# Patient Record
Sex: Female | Born: 1990 | Race: White | Hispanic: No | Marital: Married | Smoking: Former smoker
Health system: Southern US, Community
[De-identification: ages and names within clinical notes are randomized; demographics above are authoritative.]

## PROBLEM LIST (undated history)

## (undated) DIAGNOSIS — I499 Cardiac arrhythmia, unspecified: Secondary | ICD-10-CM

## (undated) DIAGNOSIS — F32A Depression, unspecified: Secondary | ICD-10-CM

## (undated) DIAGNOSIS — R519 Headache, unspecified: Secondary | ICD-10-CM

## (undated) DIAGNOSIS — R51 Headache: Secondary | ICD-10-CM

## (undated) DIAGNOSIS — F419 Anxiety disorder, unspecified: Secondary | ICD-10-CM

## (undated) DIAGNOSIS — F329 Major depressive disorder, single episode, unspecified: Secondary | ICD-10-CM

## (undated) DIAGNOSIS — R4589 Other symptoms and signs involving emotional state: Secondary | ICD-10-CM

## (undated) HISTORY — DX: Cardiac arrhythmia, unspecified: I49.9

## (undated) HISTORY — DX: Headache, unspecified: R51.9

## (undated) HISTORY — PX: DILATION AND CURETTAGE OF UTERUS: SHX78

## (undated) HISTORY — DX: Depression, unspecified: F32.A

## (undated) HISTORY — PX: WISDOM TOOTH EXTRACTION: SHX21

## (undated) HISTORY — DX: Headache: R51

## (undated) HISTORY — DX: Major depressive disorder, single episode, unspecified: F32.9

---

## 2012-08-24 ENCOUNTER — Other Ambulatory Visit: Payer: Self-pay | Admitting: Family Medicine

## 2012-08-24 ENCOUNTER — Other Ambulatory Visit (HOSPITAL_COMMUNITY)
Admission: RE | Admit: 2012-08-24 | Discharge: 2012-08-24 | Disposition: A | Discharge status: Home / Self Care | Payer: 59 | Source: Ambulatory Visit | Attending: Family Medicine | Admitting: Family Medicine

## 2012-08-24 DIAGNOSIS — Z0000 Encounter for general adult medical examination without abnormal findings: Secondary | ICD-10-CM | POA: Insufficient documentation

## 2012-08-27 ENCOUNTER — Other Ambulatory Visit: Payer: Self-pay | Admitting: Family Medicine

## 2012-08-27 DIAGNOSIS — N644 Mastodynia: Secondary | ICD-10-CM

## 2012-08-29 ENCOUNTER — Ambulatory Visit
Admission: RE | Admit: 2012-08-29 | Discharge: 2012-08-29 | Disposition: A | Discharge status: Home / Self Care | Payer: 59 | Source: Ambulatory Visit | Attending: Family Medicine | Admitting: Family Medicine

## 2012-08-29 DIAGNOSIS — N644 Mastodynia: Secondary | ICD-10-CM

## 2014-06-26 ENCOUNTER — Emergency Department (HOSPITAL_COMMUNITY)
Admission: EM | Admit: 2014-06-26 | Discharge: 2014-06-26 | Discharge status: Against Medical Advice | Payer: Medicaid Other | Attending: Emergency Medicine | Admitting: Emergency Medicine

## 2014-06-26 ENCOUNTER — Encounter (HOSPITAL_COMMUNITY): Payer: Self-pay | Admitting: Emergency Medicine

## 2014-06-26 DIAGNOSIS — Z3A00 Weeks of gestation of pregnancy not specified: Secondary | ICD-10-CM | POA: Diagnosis not present

## 2014-06-26 DIAGNOSIS — O210 Mild hyperemesis gravidarum: Secondary | ICD-10-CM | POA: Insufficient documentation

## 2014-06-26 HISTORY — DX: Anxiety disorder, unspecified: F41.9

## 2014-06-26 HISTORY — DX: Other symptoms and signs involving emotional state: R45.89

## 2014-06-26 HISTORY — DX: Major depressive disorder, single episode, unspecified: F32.9

## 2014-06-26 LAB — URINALYSIS, ROUTINE W REFLEX MICROSCOPIC
Bilirubin Urine: NEGATIVE
Glucose, UA: NEGATIVE mg/dL
Hgb urine dipstick: NEGATIVE
KETONES UR: 15 mg/dL — AB
NITRITE: NEGATIVE
PH: 7.5 (ref 5.0–8.0)
Protein, ur: NEGATIVE mg/dL
SPECIFIC GRAVITY, URINE: 1.023 (ref 1.005–1.030)
Urobilinogen, UA: 1 mg/dL (ref 0.0–1.0)

## 2014-06-26 LAB — COMPREHENSIVE METABOLIC PANEL
ALK PHOS: 63 U/L (ref 39–117)
ALT: 13 U/L (ref 0–35)
AST: 15 U/L (ref 0–37)
Albumin: 3.6 g/dL (ref 3.5–5.2)
Anion gap: 11 (ref 5–15)
BILIRUBIN TOTAL: 0.4 mg/dL (ref 0.3–1.2)
CHLORIDE: 105 mmol/L (ref 96–112)
CO2: 21 mmol/L (ref 19–32)
CREATININE: 0.64 mg/dL (ref 0.50–1.10)
Calcium: 9.2 mg/dL (ref 8.4–10.5)
GFR calc non Af Amer: >90 mL/min (ref >90)
Glucose, Bld: 92 mg/dL (ref 70–99)
Potassium: 3.8 mmol/L (ref 3.5–5.1)
Sodium: 137 mmol/L (ref 135–145)
Total Protein: 7.3 g/dL (ref 6.0–8.3)

## 2014-06-26 LAB — CBC WITH DIFFERENTIAL/PLATELET
BASOS ABS: 0 10*3/uL (ref 0.0–0.1)
Basophils Relative: 0 % (ref 0–1)
EOS ABS: 0.1 10*3/uL (ref 0.0–0.7)
Eosinophils Relative: 1 % (ref 0–5)
HEMATOCRIT: 39.3 % (ref 36.0–46.0)
Hemoglobin: 13.1 g/dL (ref 12.0–15.0)
Lymphocytes Relative: 26 % (ref 12–46)
Lymphs Abs: 1.9 10*3/uL (ref 0.7–4.0)
MCH: 28 pg (ref 26.0–34.0)
MCHC: 33.3 g/dL (ref 30.0–36.0)
MCV: 84 fL (ref 78.0–100.0)
MONO ABS: 0.6 10*3/uL (ref 0.1–1.0)
Monocytes Relative: 8 % (ref 3–12)
NEUTROS ABS: 4.7 10*3/uL (ref 1.7–7.7)
Neutrophils Relative %: 65 % (ref 43–77)
Platelets: 243 10*3/uL (ref 150–400)
RBC: 4.68 MIL/uL (ref 3.87–5.11)
RDW: 12.8 % (ref 11.5–15.5)
WBC: 7.3 10*3/uL (ref 4.0–10.5)

## 2014-06-26 LAB — POC URINE PREG, ED: Preg Test, Ur: POSITIVE — AB

## 2014-06-26 LAB — URINE MICROSCOPIC-ADD ON

## 2014-06-26 NOTE — ED Notes (Addendum)
PT has been skipping periods since January. Had light period in February that was not like normal periods. Hx of miscarriage where didn't expel and she says feels similar. Recently started on antidepressant and antianxiety meds in January. Past couple of days has be anorexic and emesis x 6. Denies diarr. Had 2 positive preg tests a few days ago.

## 2014-06-26 NOTE — ED Notes (Signed)
EMT in mini lab informed nurse first of positive pregnancy test, pt informed, awaiting ED bed placement, pt is drinking sprite, in no acute distress.

## 2014-06-26 NOTE — ED Notes (Signed)
Pt called again for room no response. 

## 2014-06-26 NOTE — ED Notes (Signed)
Called for room x3 no response 

## 2015-02-28 ENCOUNTER — Emergency Department (HOSPITAL_COMMUNITY)
Admission: EM | Admit: 2015-02-28 | Discharge: 2015-02-28 | Disposition: A | Discharge status: Home / Self Care | Payer: Medicaid Other | Attending: Emergency Medicine | Admitting: Emergency Medicine

## 2015-02-28 ENCOUNTER — Encounter (HOSPITAL_COMMUNITY): Payer: Self-pay | Admitting: *Deleted

## 2015-02-28 DIAGNOSIS — F17200 Nicotine dependence, unspecified, uncomplicated: Secondary | ICD-10-CM | POA: Insufficient documentation

## 2015-02-28 DIAGNOSIS — F419 Anxiety disorder, unspecified: Secondary | ICD-10-CM | POA: Insufficient documentation

## 2015-02-28 DIAGNOSIS — E669 Obesity, unspecified: Secondary | ICD-10-CM | POA: Insufficient documentation

## 2015-02-28 DIAGNOSIS — F329 Major depressive disorder, single episode, unspecified: Secondary | ICD-10-CM | POA: Insufficient documentation

## 2015-02-28 DIAGNOSIS — R51 Headache: Secondary | ICD-10-CM | POA: Insufficient documentation

## 2015-02-28 LAB — BASIC METABOLIC PANEL
ANION GAP: 9 (ref 5–15)
BUN: 10 mg/dL (ref 6–20)
CALCIUM: 9 mg/dL (ref 8.9–10.3)
CO2: 24 mmol/L (ref 22–32)
Chloride: 106 mmol/L (ref 101–111)
Creatinine, Ser: 0.67 mg/dL (ref 0.44–1.00)
GFR calc Af Amer: >60 mL/min (ref >60)
Glucose, Bld: 90 mg/dL (ref 65–99)
POTASSIUM: 3.8 mmol/L (ref 3.5–5.1)
SODIUM: 139 mmol/L (ref 135–145)

## 2015-02-28 LAB — CBC
HCT: 40.5 % (ref 36.0–46.0)
HEMOGLOBIN: 13.3 g/dL (ref 12.0–15.0)
MCH: 28.4 pg (ref 26.0–34.0)
MCHC: 32.8 g/dL (ref 30.0–36.0)
MCV: 86.4 fL (ref 78.0–100.0)
Platelets: 262 10*3/uL (ref 150–400)
RBC: 4.69 MIL/uL (ref 3.87–5.11)
RDW: 13.3 % (ref 11.5–15.5)
WBC: 9.5 10*3/uL (ref 4.0–10.5)

## 2015-02-28 LAB — I-STAT TROPONIN, ED: Troponin i, poc: 0 ng/mL (ref 0.00–0.08)

## 2015-02-28 NOTE — ED Notes (Signed)
Bed: WA04 Expected date: 02/28/15 Expected time: 3:37 PM Means of arrival: Ambulance Comments: Hyperventilating, anxiety

## 2015-02-28 NOTE — ED Provider Notes (Signed)
CSN: [id number]     Arrival date & time 02/28/15  1441 History   First MD Initiated Contact with Patient 02/28/15 1534     Chief Complaint  Patient presents with  . Anxiety  . Chest Pain  . Headache     (Consider location/radiation/quality/duration/timing/severity/associated sxs/prior Treatment) HPI   Ariana Harper is a 24 y.o. female  was at work today, when she had a sudden sensation of lightheadedness. Tingling in her hands, short of breath and chest discomfort. The symptoms persisted, for about an hour, then have resolved completely. She reports she is under stress with a problem of purchasing a new home, and having town guests visiting her for the holiday. She's never had a problem exactly like this, although she has had mild anxiety attacks in the past. She denies recent fever, chills, cough, shortness of breath, new weakness or dizziness. There are no other known modifying factors  Past Medical History  Diagnosis Date  . Depressed mood   . Anxiety    History reviewed. No pertinent past surgical history. No family history on file. Social History  Substance Use Topics  . Smoking status: Current Every Day Smoker  . Smokeless tobacco: None  . Alcohol Use: No   OB History    No data available     Review of Systems  All other systems reviewed and are negative.     Allergies  Azithromycin  Home Medications   Prior to Admission medications   Medication Sig Start Date End Date Taking? Authorizing Provider  ALPRAZolam Prudy Feeler(XANAX) 0.5 MG tablet Take 0.5 mg by mouth 2 (two) times daily as needed for anxiety.   Yes Historical Provider, MD   BP 120/75 mmHg  Pulse 71  Temp(Src) 97.9 F (36.6 C) (Oral)  Resp 20  SpO2 100%  LMP 02/28/2015 Physical Exam  Constitutional: She is oriented to person, place, and time. She appears well-developed.  Obese   HENT:  Head: Normocephalic and atraumatic.  Eyes: Conjunctivae and EOM are normal. Pupils are equal, round, and  reactive to light.  Neck: Normal range of motion and phonation normal. Neck supple.  Cardiovascular: Normal rate and regular rhythm.   Pulmonary/Chest: Effort normal and breath sounds normal. She exhibits no tenderness.  Abdominal: Soft. She exhibits no distension. There is no tenderness. There is no guarding.  Musculoskeletal: Normal range of motion.  Neurological: She is alert and oriented to person, place, and time. She exhibits normal muscle tone.  Skin: Skin is warm and dry.  Psychiatric: Her behavior is normal. Judgment and thought content normal.  Nursing note and vitals reviewed.   ED Course  Procedures (including critical care time)  Findings discussed with the patient, all questions answered.   Labs Review Labs Reviewed  BASIC METABOLIC PANEL  CBC  I-STAT TROPOININ, ED    Imaging Review No results found. I have personally reviewed and evaluated these images and lab results as part of my medical decision-making.   EKG Interpretation   Date/Time:  Saturday February 28 2015 15:02:39 EST Ventricular Rate:  63 PR Interval:  179 QRS Duration: 89 QT Interval:  423 QTC Calculation: 433 R Axis:   14 Text Interpretation:  Sinus rhythm RSR' in V1 or V2, probably normal  variant No old tracing to compare Confirmed by [facility]  MD, Kavir Savoca [phone]([id number])  on 02/28/2015 3:56:24 PM      MDM   Final diagnoses:  Anxiety    Valuation, consistent with a panic attack. Doubt acute psychosis, metabolic  instability or impending vascular collapse.  Nursing Notes Reviewed/ Care Coordinated Applicable Imaging Reviewed Interpretation of Laboratory Data incorporated into ED treatment  The patient appears reasonably screened and/or stabilized for discharge and I doubt any other medical condition or other [facility] requiring further screening, evaluation, or treatment in the ED at this time prior to discharge.  Plan: Home Medications- usual; Home Treatments- rest; return here if the  recommended treatment, does not improve the symptoms; Recommended follow up- PCP prn    Mancel Bale, MD 03/01/15 0009

## 2015-02-28 NOTE — ED Notes (Addendum)
Per EMS, pt was at work, where she states she had an "overwhelming" feeling. Pt states she felt lightheaded, chills and tingling in her hands. Pt was put on O2, after which the tingling resolved. Pt has hx of anxiety attacks, is currently on Xanax. Pt denied chest pain upon EMS arrival, pt complains of chest pressure in triage. Pt states it does not feel like normal anxiety attack. Pt also complains of headache.

## 2015-03-24 ENCOUNTER — Encounter (HOSPITAL_COMMUNITY): Payer: Self-pay

## 2015-03-24 ENCOUNTER — Emergency Department (HOSPITAL_COMMUNITY): Payer: Medicaid Other

## 2015-03-24 ENCOUNTER — Emergency Department (HOSPITAL_COMMUNITY)
Admission: EM | Admit: 2015-03-24 | Discharge: 2015-03-24 | Disposition: A | Discharge status: Home / Self Care | Payer: Medicaid Other | Attending: Emergency Medicine | Admitting: Emergency Medicine

## 2015-03-24 DIAGNOSIS — F17200 Nicotine dependence, unspecified, uncomplicated: Secondary | ICD-10-CM | POA: Insufficient documentation

## 2015-03-24 DIAGNOSIS — E876 Hypokalemia: Secondary | ICD-10-CM | POA: Insufficient documentation

## 2015-03-24 DIAGNOSIS — R079 Chest pain, unspecified: Secondary | ICD-10-CM | POA: Insufficient documentation

## 2015-03-24 DIAGNOSIS — F419 Anxiety disorder, unspecified: Secondary | ICD-10-CM | POA: Insufficient documentation

## 2015-03-24 DIAGNOSIS — R002 Palpitations: Secondary | ICD-10-CM

## 2015-03-24 LAB — BASIC METABOLIC PANEL
ANION GAP: 9 (ref 5–15)
BUN: 12 mg/dL (ref 6–20)
CHLORIDE: 108 mmol/L (ref 101–111)
CO2: 24 mmol/L (ref 22–32)
CREATININE: 0.67 mg/dL (ref 0.44–1.00)
Calcium: 8.9 mg/dL (ref 8.9–10.3)
GFR calc non Af Amer: >60 mL/min (ref >60)
Glucose, Bld: 98 mg/dL (ref 65–99)
POTASSIUM: 3.2 mmol/L — AB (ref 3.5–5.1)
SODIUM: 141 mmol/L (ref 135–145)

## 2015-03-24 LAB — I-STAT TROPONIN, ED: Troponin i, poc: 0 ng/mL (ref 0.00–0.08)

## 2015-03-24 LAB — CBC
HEMATOCRIT: 38.3 % (ref 36.0–46.0)
HEMOGLOBIN: 12.4 g/dL (ref 12.0–15.0)
MCH: 27.6 pg (ref 26.0–34.0)
MCHC: 32.4 g/dL (ref 30.0–36.0)
MCV: 85.1 fL (ref 78.0–100.0)
PLATELETS: 271 10*3/uL (ref 150–400)
RBC: 4.5 MIL/uL (ref 3.87–5.11)
RDW: 13.1 % (ref 11.5–15.5)
WBC: 9.1 10*3/uL (ref 4.0–10.5)

## 2015-03-24 LAB — MAGNESIUM: Magnesium: 1.9 mg/dL (ref 1.7–2.4)

## 2015-03-24 MED ORDER — POTASSIUM CHLORIDE CRYS ER 20 MEQ PO TBCR
20.0000 meq | EXTENDED_RELEASE_TABLET | Freq: Once | ORAL | Status: AC
  Administered 2015-03-24: 20 meq via ORAL
  Filled 2015-03-24: qty 1

## 2015-03-24 NOTE — ED Notes (Signed)
Spoke with lab, magnesium will be added on the blood that is already in main lab.

## 2015-03-24 NOTE — ED Provider Notes (Signed)
CSN: [id number]     Arrival date & time 03/24/15  1657 History   First MD Initiated Contact with Patient 03/24/15 1755     Chief Complaint  Patient presents with  . Irregular Heart Beat  . Chest Pain     (Consider location/radiation/quality/duration/timing/severity/associated sxs/prior Treatment) Patient is a 25 y.o. female presenting with chest pain. The history is provided by the patient and medical records. No language interpreter was used.  Chest Pain Associated symptoms: fatigue and palpitations   Associated symptoms: no abdominal pain, no back pain, no cough, no diaphoresis, no dizziness, no fever, no headache, no nausea, no shortness of breath, not vomiting and no weakness    Addison LankStephanie A Filion is a 25 y.o. female  with a PMH of anxiety who presents to the Emergency Department after acute onset of heart palpitations and chest discomfort at approx. 04:00 today. Pt. States she was smoking at time of incident - associated symptoms include lightheadedness and tingling of bilateral UE's. Pt. States similar sxs have occurred in the past and told it was anxiety. At time of evaluation, chest pain and tingling has resolved. No medications taken PTA. Pt. Also admits to diarrhea x 3 days.   Past Medical History  Diagnosis Date  . Depressed mood   . Anxiety    History reviewed. No pertinent past surgical history. History reviewed. No pertinent family history. Social History  Substance Use Topics  . Smoking status: Current Every Day Smoker  . Smokeless tobacco: None  . Alcohol Use: No   OB History    No data available     Review of Systems  Constitutional: Positive for fatigue. Negative for fever, chills and diaphoresis.  HENT: Negative for congestion, rhinorrhea and sore throat.   Respiratory: Negative for cough, shortness of breath and wheezing.   Cardiovascular: Positive for chest pain and palpitations. Negative for leg swelling.  Gastrointestinal: Negative for nausea, vomiting,  abdominal pain, diarrhea and constipation.  Musculoskeletal: Negative for myalgias, back pain, arthralgias and neck pain.  Skin: Negative for rash.  Allergic/Immunologic: Negative for immunocompromised state.  Neurological: Negative for dizziness, weakness and headaches.  Hematological: Does not bruise/bleed easily.      Allergies  Azithromycin  Home Medications   Prior to Admission medications   Medication Sig Start Date End Date Taking? Authorizing Provider  ALPRAZolam Prudy Feeler(XANAX) 0.5 MG tablet Take 0.5 mg by mouth 2 (two) times daily as needed for anxiety.   Yes Historical Provider, MD  Aspirin-Acetaminophen-Caffeine (GOODY HEADACHE PO) Take 1 packet by mouth daily as needed (pain).   Yes Historical Provider, MD   BP 127/70 mmHg  Pulse 63  Temp(Src) 98.3 F (36.8 C) (Oral)  Resp 21  SpO2 99%  LMP 03/24/2015 Physical Exam  Constitutional: She is oriented to person, place, and time. She appears well-developed and well-nourished.  Alert and in no acute distress  HENT:  Head: Normocephalic and atraumatic.  Cardiovascular: Normal rate, regular rhythm, normal heart sounds and intact distal pulses.  Exam reveals no gallop and no friction rub.   No murmur heard. Pulmonary/Chest: Effort normal and breath sounds normal. No respiratory distress. She has no wheezes. She has no rales. She exhibits no tenderness.  Abdominal: She exhibits no mass. There is no rebound and no guarding.  Abdomen soft, non-tender, non-distended Bowel sounds positive in all four quadrants  Musculoskeletal: She exhibits no edema.  Neurological: She is alert and oriented to person, place, and time.  Skin: Skin is warm and dry. No rash  noted.  Psychiatric: She has a normal mood and affect. Her behavior is normal. Judgment and thought content normal.  Nursing note and vitals reviewed.   ED Course  Procedures (including critical care time) Labs Review Labs Reviewed  BASIC METABOLIC PANEL - Abnormal; Notable  for the following:    Potassium 3.2 (*)    All other components within normal limits  CBC  MAGNESIUM  I-STAT TROPOININ, ED    Imaging Review Dg Chest 2 View  03/24/2015  CLINICAL DATA:  Chest pain beginning while smoking EXAM: CHEST  2 VIEW COMPARISON:  None. FINDINGS: Normal heart size and mediastinal contours. No acute infiltrate or edema. No effusion or pneumothorax. No acute osseous findings. IMPRESSION: Negative chest. Electronically Signed   By: Marnee Spring M.D.   On: 03/24/2015 17:22   I have personally reviewed and evaluated these images and lab results as part of my medical decision-making.   EKG Interpretation None      MDM   Final diagnoses:  Hypokalemia  Heart palpitations   RANE DUMM presents with acute onset chest discomfort, tingling of UE's. Hx of similar sxs. HEART score of 1, PERC negative.   Labs: Trop 0.0, CBC wdl, BMP with K+ 3.2, Mag wdl Imaging: CXR shows no acute cardiopulm dz  Therapeutics: PO K+   A&P: Heart palpitations  - low risk for cardiopulm etiology; anxiety or chest wall spasms ddx  - Follow up with PCP  - Smoking cessation discussed Hypokalemia likely 2/2 recent 3 day history of diarrhea  - Follow up with PCP  Chase Picket Aldene Hendon, PA-C 03/24/15 1610  Lavera Guise, MD 03/25/15 1432

## 2015-03-24 NOTE — ED Notes (Signed)
Pt states she was at work when she was smoking.  Sudden numbness to left arm and felt heart racing. No symptoms of changes prior to event.  Pt states now she is light headed.

## 2015-03-24 NOTE — Discharge Instructions (Signed)
Follow up with primary physician in the next 2-3 days. See attached resource guide for help finding a primary doctor Return to ER for any new or worsening symptoms, any additional concerns.   Emergency Department Resource Guide 1) Find a Doctor and Pay Out of Pocket Although you won't have to find out who is covered by your insurance plan, it is a good idea to ask around and get recommendations. You will then need to call the office and see if the doctor you have chosen will accept you as a new patient and what types of options they offer for patients who are self-pay. Some doctors offer discounts or will set up payment plans for their patients who do not have insurance, but you will need to ask so you aren't surprised when you get to your appointment.  2) Contact Your Local Health Department Not all health departments have doctors that can see patients for sick visits, but many do, so it is worth a call to see if yours does. If you don't know where your local health department is, you can check in your phone book. The CDC also has a tool to help you locate your state's health department, and many state websites also have listings of all of their local health departments.  3) Find a Walk-in Clinic If your illness is not likely to be very severe or complicated, you may want to try a walk in clinic. These are popping up all over the country in pharmacies, drugstores, and shopping centers. They're usually staffed by nurse practitioners or physician assistants that have been trained to treat common illnesses and complaints. They're usually fairly quick and inexpensive. However, if you have serious medical issues or chronic medical problems, these are probably not your best option.  No Primary Care Doctor: - Call Health Connect at  [phone] - they can help you locate a primary care doctor that  accepts your insurance, provides certain services, etc. - Physician Referral Service- [phone]  Chronic  Pain Problems: Organization         Address  Phone   Notes  [facility]  [phone] Patients need to be referred by their primary care doctor.   Medication Assistance: Organization         Address  Phone   Notes  [facility] Medication [facility] [address] [place], [place] [address] [phone] --Must be a resident of [facility] -- Must have NO insurance coverage whatsoever (no Medicaid/ Medicare, etc.) -- The pt. MUST have a primary care doctor that directs their care regularly and follows them in the community   MedAssist  [phone]   [employer]  [phone]    Agencies that provide inexpensive medical care: Organization         Address  Phone   Notes  [facility]  [phone]   [facility]    [phone]   [facility] [address] [place], [place] [address] [phone]   [facility] [place] [address], [place] [phone]   [facility]    [phone]) [phone]   [facility]    [phone]   [facility]  [address], [place] Phone:  [phone], Fax:  [phone]) [phone] Hours of Operation:  9 am - 6 pm, M-F.  Also accepts Medicaid/Medicare and self-pay.  [facility]  [address], [place] Phone: [phone], Fax: [phone]. Hours of Operation:  8:30 am - 5:30 pm, M-F.  Also accepts Medicaid and self-pay.  °[facility] [place] [address], [place] Phone: [phone]   °[facility] [address], [place], [place] [phone], Ext. 123 Mondays & Thursdays: 7-9 AM.  First 15 patients are seen on a first come, first serve basis. °  ° °Medicaid-accepting [place] Providers: ° °Organization         Address  Phone   Notes  °[facility] [address], [place] [phone] Also  accepts self-pay patients.  °[facility] [address], [place] ° [phone]   °[facility] [address], [place] [phone]   °[facility] [address] [place] [address], [place] [phone]   °Veita Bland [address], [place]  ° [phone] Only accepts [place] Access Medicaid patients after they have their name applied to their card.  ° °Self-Pay (no insurance) in [place]: ° °Organization         Address  Phone   Notes  °Sickle Cell Patients, [facility] [address], [place] [phone]   °[facility] [address], [place] [phone]   °[facility] [place] ° [address], [place] [phone]   °[facility]/Dr. Osei-Bonsu ° [address] [place] [address], [place] or [address], [place] [phone] Phone number for both [place] and [place] locations is the same.  °[facility] [address], [place] [phone]) [phone]   °[facility] [address] [place] [address], [place] or [address] [phone]) [phone] °([phone]) [phone]   °[facility] [address], [place] [phone], phone; [phone], fax Sees patients 1st and 3rd Saturday of every month.  Must not qualify for public or private insurance (i.e. Medicaid, Medicare, [place] Health Choice, Veterans' Benefits) • Household income should be no more than 200% of the poverty level •The clinic cannot treat you if you are pregnant or think you are pregnant • Sexually transmitted diseases are not treated at the clinic.  ° ° °Dental Care: °Organization         Address  Phone  Notes  °[place] [facility] [address], [place] [phone]) [phone] Accepts children up to age 21 who are enrolled in Medicaid or [place] Health Choice; pregnant  women with a Medicaid card; and children who have applied for Medicaid or [place] Health Choice, but were declined, whose parents can pay a reduced fee at time of service.  °[place] [facility] [place]  [address], [place] [phone]) [phone] Accepts children up to age 21 who are enrolled in Medicaid or [place] Health Choice; pregnant women with a Medicaid card; and children who have applied for Medicaid or  Health Choice, but were declined, whose parents can pay a reduced fee at time of service.  °[facility] ° [address],  [phone] Patients are seen by appointment only. Walk-ins are not accepted. [facility] will see patients 18 years of age and older. °Monday - Tuesday (8am-5pm) °Most Wednesdays (8:30-5pm) °$30 per visit, cash only  °[facility]  [address], [facility] [phone] Patients are seen by appointment only. Walk-ins are not accepted. [place] will see patients 60 years of age and older. One Wednesday Evening (Monthly: Volunteer Based).  $30 per visit, cash only  [place]  [phone] for adults; Children under age 4, call Graduate Pediatric Dentistry at [phone]. Children aged 49-14, please call [phone] to request a pediatric application.  Dental services are provided in all areas of dental care including fillings, crowns and bridges, complete and partial dentures, implants, gum treatment, root canals, and extractions. Preventive care is also provided. Treatment is provided to both adults and children. Patients are selected via a lottery and there is often a waiting list.   [facility] [address] [place]  [phone] [email or web address]   [facility] [address] [place], [place] [phone], Ext. [phone] Second and Fourth Thursday of each month, opens at 6:30 AM; Clinic ends at 9 AM.  Patients are  seen on a first-come first-served basis, and a limited number are seen during each clinic.   [facility]  [address] Hillard Danker [place], [place] [phone]   Eligibility Requirements You must have lived in [place], [place], or [place] counties for at least the last three months.   You cannot be eligible for state or federal sponsored [employer], including [employer], [place], or [employer].   You generally cannot be eligible for healthcare insurance through your employer.    How to apply: Eligibility screenings are held every Tuesday and Wednesday afternoon from 1:00 pm until 4:00 pm. You do not need an appointment for the interview!  [facility] [address] [place], [place]   [place] Department  [place]  [phone]    Behavioral Health Resources in the Community: Intensive Outpatient Programs Organization         Address  Phone  Notes  [facility] [place]. [address], [place], [place] [phone]   [facility] [address], [place], [place]   ADS: [facility] [address] [address], [place], [place]   [place] [address],  [place], [place] or [phone]   Substance Abuse Resources Organization         Address  Phone  Notes  Alcohol and Drug Services  [phone]   [place]  [phone]   [facility] [place]   Chinita Pester  [phone]   Residential & Outpatient Substance Abuse Program  [phone]   Psychological Services Organization         Address  Phone  Notes  [facility] [place]  [place]  [phone]   [place] [address] [place] [phone] or 7327526419    Mobile Crisis  Teams Organization         Address  Phone  Notes  [facility], Mobile Crisis Care Unit  [phone]   [facility]  [address] [place], [place]   [facility] [address] [place] [phone]    Self-Help/Support Groups Organization         Address  Phone             Notes  [facility]ental  [facility] [place] - variety of support groups  [place] Call for more information  Narcotics Anonymous (NA), [facility] [address], [employer] [place]  2 meetings at this location   Special educational needs teacher         Address  Phone  Notes  [facility] [place],    [place]  [phone]   [facility]  [address], [place] [address], [place], [place]   [place], [place] [phone] Admissions: 8am-3pm M-F  Incentives Substance [place] [address],    [place], [place] [phone]   [facility] [address] [place], [place], [place]   [facility] [address]  [place], [place]   [facility] - Intensive Outpatient [address], [place], [place]   [facility] [place]  [place], [place] [phone] or [phone]   [facility] ([facility]) [address] [place], [place] Accepts Medicaid  [facility] [place] [address],  [place] [phone] Substance Abuse/Addiction Treatment   [facility] Organization         Address  Phone  Notes  [facility]  [phone]   Domenic Schwab, PhD [address] Arlis [place], [place]   [phone] or [phone]   [place], [place] [phone]   [facility] [address], [place], [place] [phone]  Insurance/Medicaid/sponsorship through [facility] Families [address] [place] [address]                                    [place], [place] [phone] [place] [address], [place] [phone]    Dr. Adele Schilder  [phone]   [facility] Dept. 1) [address] [place] 2) [place] 3)  [address] [address], [place] [phone]  [phone]   [place] [phone] or [phone] (After Hours)

## 2015-03-24 NOTE — ED Notes (Signed)
Bed: WA14 Expected date:  Expected time:  Means of arrival:  Comments: Ems fall 

## 2016-06-14 IMAGING — CR DG CHEST 2V
2 series · 2 of 2 positions shown · non-contrast
Comparison: None.

CLINICAL DATA: Chest pain beginning while smoking

EXAM:
CHEST  2 VIEW

[w chest pa]
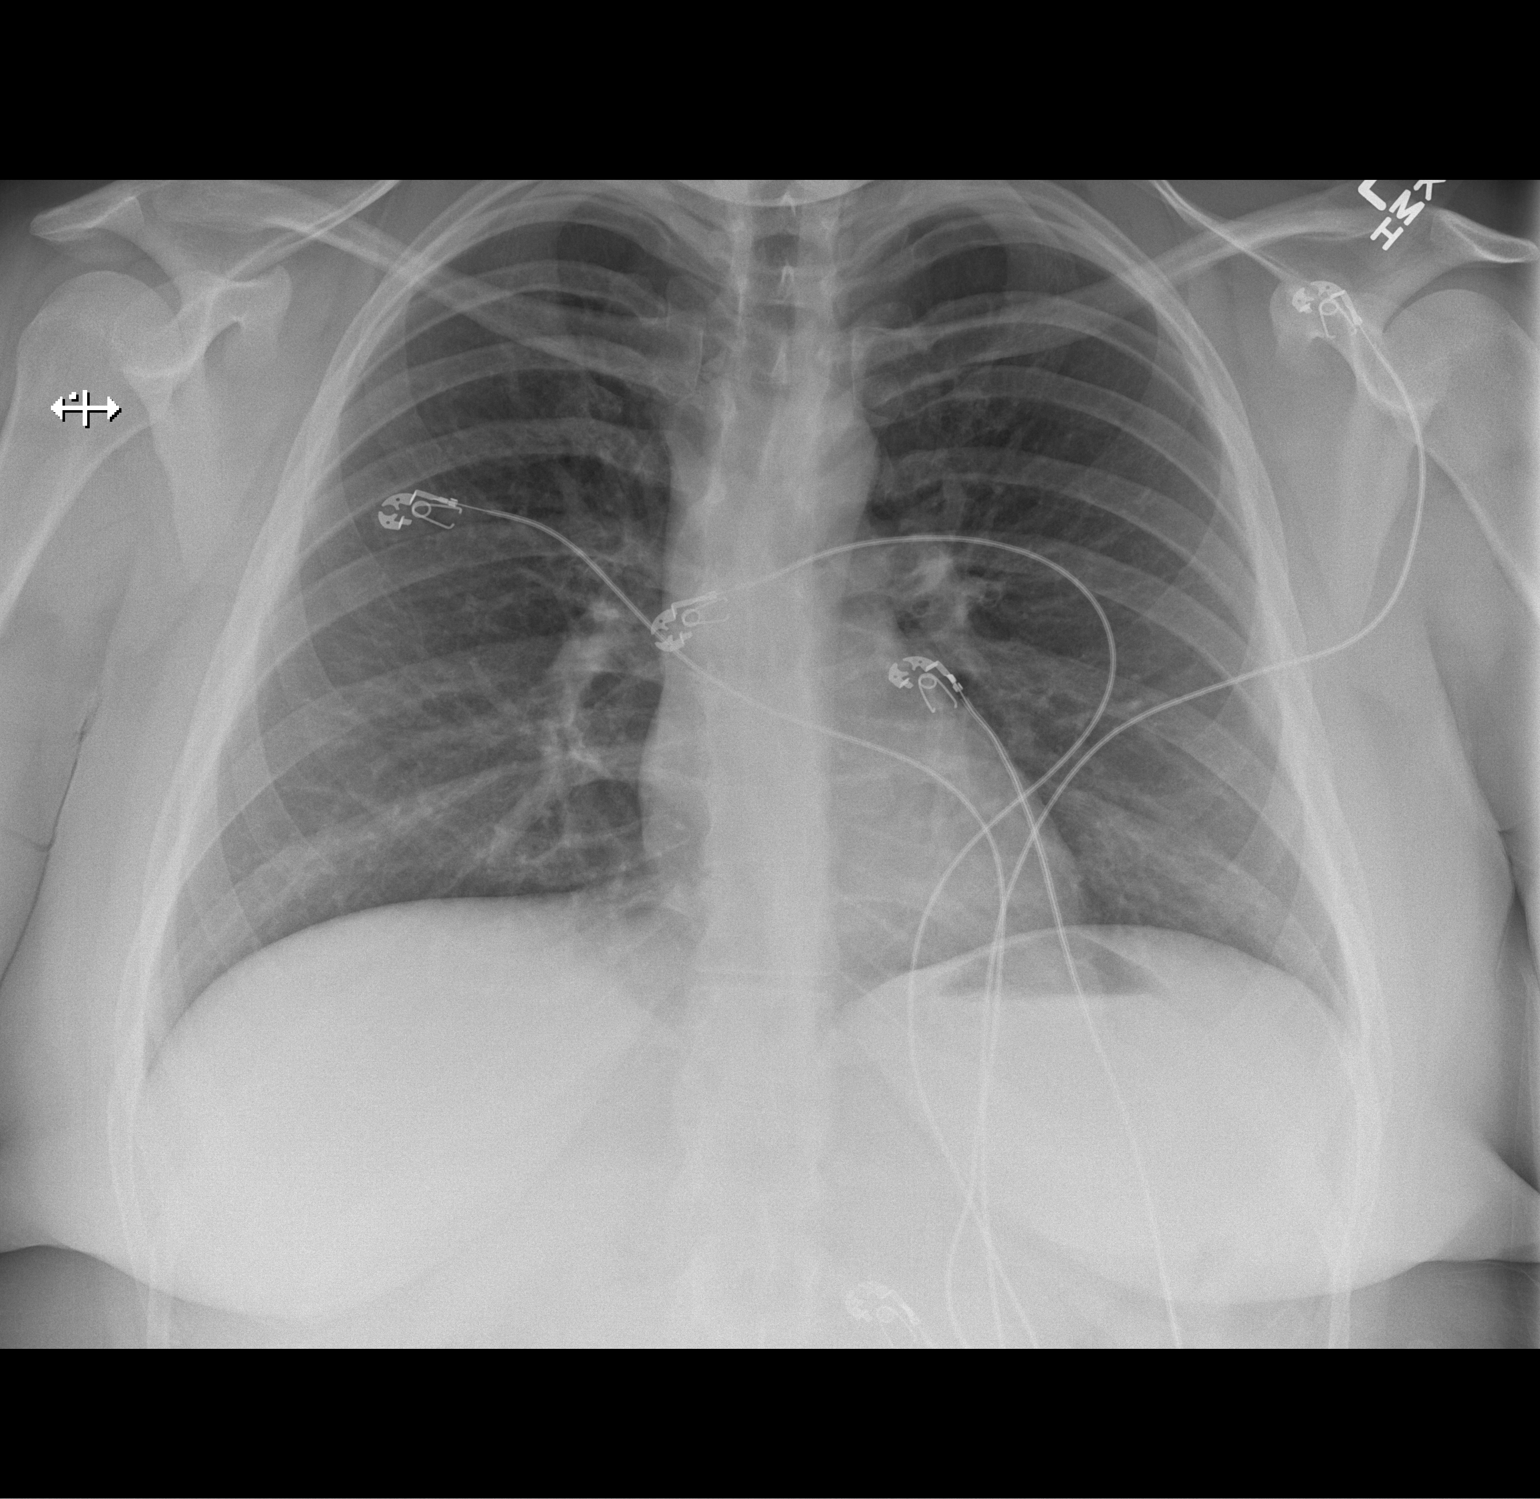

[w chest lat]
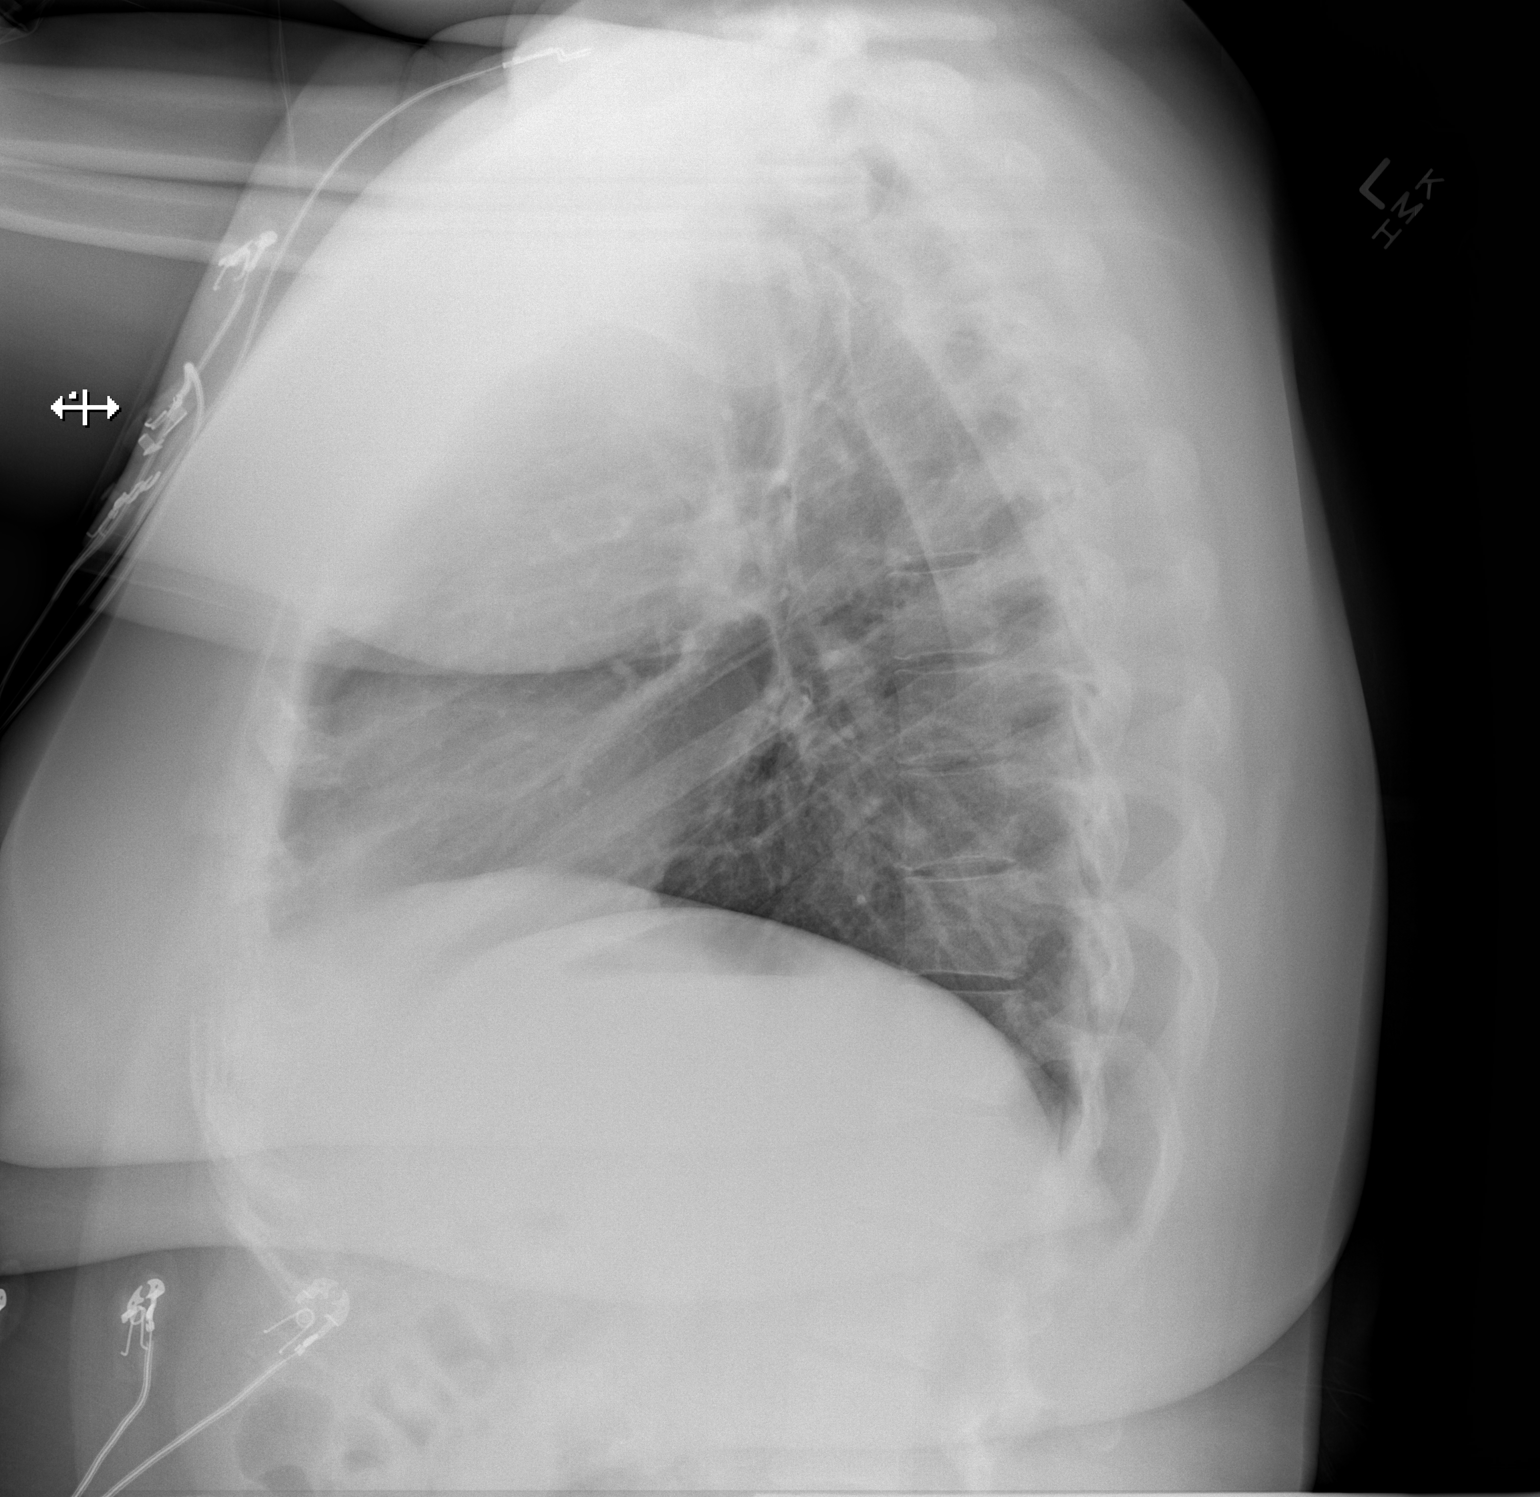

[2 of 2 positions shown; findings below may reference images not displayed]

FINDINGS: Normal heart size and mediastinal contours. No acute infiltrate or
edema. No effusion or pneumothorax. No acute osseous findings.
IMPRESSION: Negative chest.

## 2016-10-21 ENCOUNTER — Encounter (HOSPITAL_COMMUNITY): Payer: Self-pay | Admitting: Nurse Practitioner

## 2016-10-21 DIAGNOSIS — Z5321 Procedure and treatment not carried out due to patient leaving prior to being seen by health care provider: Secondary | ICD-10-CM | POA: Insufficient documentation

## 2016-10-21 DIAGNOSIS — R109 Unspecified abdominal pain: Secondary | ICD-10-CM | POA: Insufficient documentation

## 2016-10-21 LAB — CBC
HEMATOCRIT: 34.6 % — AB (ref 36.0–46.0)
Hemoglobin: 11.5 g/dL — ABNORMAL LOW (ref 12.0–15.0)
MCH: 27.6 pg (ref 26.0–34.0)
MCHC: 33.2 g/dL (ref 30.0–36.0)
MCV: 83.2 fL (ref 78.0–100.0)
Platelets: 263 10*3/uL (ref 150–400)
RBC: 4.16 MIL/uL (ref 3.87–5.11)
RDW: 13.7 % (ref 11.5–15.5)
WBC: 10.1 10*3/uL (ref 4.0–10.5)

## 2016-10-21 LAB — I-STAT BETA HCG BLOOD, ED (MC, WL, AP ONLY): I-stat hCG, quantitative: <5 m[IU]/mL (ref <5)

## 2016-10-21 LAB — COMPREHENSIVE METABOLIC PANEL
ALBUMIN: 3.9 g/dL (ref 3.5–5.0)
ALT: 17 U/L (ref 14–54)
AST: 15 U/L (ref 15–41)
Alkaline Phosphatase: 63 U/L (ref 38–126)
Anion gap: 7 (ref 5–15)
BILIRUBIN TOTAL: 0.5 mg/dL (ref 0.3–1.2)
BUN: 9 mg/dL (ref 6–20)
CO2: 24 mmol/L (ref 22–32)
CREATININE: 0.74 mg/dL (ref 0.44–1.00)
Calcium: 8.7 mg/dL — ABNORMAL LOW (ref 8.9–10.3)
Chloride: 106 mmol/L (ref 101–111)
GFR calc Af Amer: >60 mL/min (ref >60)
GFR calc non Af Amer: >60 mL/min (ref >60)
GLUCOSE: 92 mg/dL (ref 65–99)
POTASSIUM: 3.6 mmol/L (ref 3.5–5.1)
Sodium: 137 mmol/L (ref 135–145)
TOTAL PROTEIN: 7.5 g/dL (ref 6.5–8.1)

## 2016-10-21 LAB — LIPASE, BLOOD: Lipase: 22 U/L (ref 11–51)

## 2016-10-21 NOTE — ED Triage Notes (Signed)
Pt is c/o LUQ pain. States onset was 3 hours ago, reports nausea without vomiting.

## 2016-10-22 ENCOUNTER — Emergency Department (HOSPITAL_COMMUNITY)
Admission: EM | Admit: 2016-10-22 | Discharge: 2016-10-22 | Disposition: A | Discharge status: Home / Self Care | Payer: Medicaid Other | Attending: Emergency Medicine | Admitting: Emergency Medicine

## 2016-10-22 NOTE — ED Notes (Signed)
Pt called for room, no response from lobby 

## 2016-12-12 ENCOUNTER — Emergency Department (HOSPITAL_COMMUNITY): Payer: 59

## 2016-12-12 ENCOUNTER — Encounter (HOSPITAL_BASED_OUTPATIENT_CLINIC_OR_DEPARTMENT_OTHER): Payer: Self-pay | Admitting: Emergency Medicine

## 2016-12-12 ENCOUNTER — Emergency Department (HOSPITAL_BASED_OUTPATIENT_CLINIC_OR_DEPARTMENT_OTHER)
Admission: EM | Admit: 2016-12-12 | Discharge: 2016-12-12 | Disposition: A | Discharge status: Home / Self Care | Payer: 59 | Attending: Emergency Medicine | Admitting: Emergency Medicine

## 2016-12-12 ENCOUNTER — Emergency Department (HOSPITAL_BASED_OUTPATIENT_CLINIC_OR_DEPARTMENT_OTHER): Payer: 59

## 2016-12-12 DIAGNOSIS — G43109 Migraine with aura, not intractable, without status migrainosus: Secondary | ICD-10-CM | POA: Diagnosis not present

## 2016-12-12 DIAGNOSIS — R2990 Unspecified symptoms and signs involving the nervous system: Secondary | ICD-10-CM

## 2016-12-12 DIAGNOSIS — Z3A00 Weeks of gestation of pregnancy not specified: Secondary | ICD-10-CM | POA: Insufficient documentation

## 2016-12-12 DIAGNOSIS — G43809 Other migraine, not intractable, without status migrainosus: Secondary | ICD-10-CM | POA: Insufficient documentation

## 2016-12-12 DIAGNOSIS — O9989 Other specified diseases and conditions complicating pregnancy, childbirth and the puerperium: Secondary | ICD-10-CM | POA: Insufficient documentation

## 2016-12-12 DIAGNOSIS — R202 Paresthesia of skin: Secondary | ICD-10-CM | POA: Insufficient documentation

## 2016-12-12 DIAGNOSIS — F17200 Nicotine dependence, unspecified, uncomplicated: Secondary | ICD-10-CM | POA: Insufficient documentation

## 2016-12-12 LAB — COMPREHENSIVE METABOLIC PANEL
ALT: 13 U/L — AB (ref 14–54)
AST: 14 U/L — AB (ref 15–41)
Albumin: 3.7 g/dL (ref 3.5–5.0)
Alkaline Phosphatase: 59 U/L (ref 38–126)
Anion gap: 6 (ref 5–15)
BUN: 8 mg/dL (ref 6–20)
CALCIUM: 8.5 mg/dL — AB (ref 8.9–10.3)
CHLORIDE: 106 mmol/L (ref 101–111)
CO2: 24 mmol/L (ref 22–32)
CREATININE: 0.74 mg/dL (ref 0.44–1.00)
GFR calc non Af Amer: >60 mL/min (ref >60)
GLUCOSE: 99 mg/dL (ref 65–99)
Potassium: 3.7 mmol/L (ref 3.5–5.1)
SODIUM: 136 mmol/L (ref 135–145)
Total Bilirubin: 0.4 mg/dL (ref 0.3–1.2)
Total Protein: 7.1 g/dL (ref 6.5–8.1)

## 2016-12-12 LAB — CBC WITH DIFFERENTIAL/PLATELET
BASOS PCT: 0 %
Basophils Absolute: 0 10*3/uL (ref 0.0–0.1)
EOS ABS: 0.1 10*3/uL (ref 0.0–0.7)
EOS PCT: 1 %
HCT: 35.6 % — ABNORMAL LOW (ref 36.0–46.0)
HEMOGLOBIN: 11.9 g/dL — AB (ref 12.0–15.0)
LYMPHS ABS: 2.4 10*3/uL (ref 0.7–4.0)
Lymphocytes Relative: 27 %
MCH: 27.8 pg (ref 26.0–34.0)
MCHC: 33.4 g/dL (ref 30.0–36.0)
MCV: 83.2 fL (ref 78.0–100.0)
MONO ABS: 0.6 10*3/uL (ref 0.1–1.0)
MONOS PCT: 7 %
NEUTROS PCT: 65 %
Neutro Abs: 5.7 10*3/uL (ref 1.7–7.7)
PLATELETS: 294 10*3/uL (ref 150–400)
RBC: 4.28 MIL/uL (ref 3.87–5.11)
RDW: 13.6 % (ref 11.5–15.5)
WBC: 8.8 10*3/uL (ref 4.0–10.5)

## 2016-12-12 LAB — URINALYSIS, MICROSCOPIC (REFLEX)

## 2016-12-12 LAB — URINALYSIS, ROUTINE W REFLEX MICROSCOPIC
BILIRUBIN URINE: NEGATIVE
Glucose, UA: NEGATIVE mg/dL
HGB URINE DIPSTICK: NEGATIVE
Ketones, ur: NEGATIVE mg/dL
Nitrite: NEGATIVE
PROTEIN: NEGATIVE mg/dL
Specific Gravity, Urine: 1.025 (ref 1.005–1.030)
pH: 6 (ref 5.0–8.0)

## 2016-12-12 LAB — TROPONIN I

## 2016-12-12 LAB — PREGNANCY, URINE: PREG TEST UR: POSITIVE — AB

## 2016-12-12 MED ORDER — SODIUM CHLORIDE 0.9 % IV BOLUS (SEPSIS)
500.0000 mL | Freq: Once | INTRAVENOUS | Status: AC
Start: 2016-12-12 — End: 2016-12-12
  Administered 2016-12-12: 500 mL via INTRAVENOUS

## 2016-12-12 NOTE — ED Notes (Addendum)
Received report from [facility]. Pt having numbness to R side, R arm and R thigh. Resolved to all except R side. Pt here for MRI

## 2016-12-12 NOTE — ED Provider Notes (Signed)
7:20 PM She has arrived from [facility] is planned to have a brain MRI.  At this time she states that the numbness which she had in her right side is "almost gone."  She states that earlier she had some headache and upper back pain but those have resolved.  She also had some numbness in her.  She has been walking well.  8:35 PM-MRI brain return, somewhat suboptimal, no acute changes.  At this time she states that her numbness in her right hemithorax is completely gone.  She does have some residual pain in her right upper leg, which she did not tell me about earlier.  20: 33-case discussed with neuro hospitalist, who will evaluate the patient in the emergency department.   20: 45-she has been seen by Dr. Luisa Hart who advises that the patient likely had a migraine without headache, to explain her symptoms.  This is more common in pregnancy.  Patient can be treated symptomatically and follow-up with neurology, as needed.    Mancel Bale, MD 12/12/16 [phone]

## 2016-12-12 NOTE — ED Notes (Signed)
Pt verbalized d.c instructions and follow up care, no further questions

## 2016-12-12 NOTE — ED Notes (Signed)
Dr. Kirkpatrick at bedside 

## 2016-12-12 NOTE — ED Notes (Signed)
Pt returned from MRI °

## 2016-12-12 NOTE — Discharge Instructions (Signed)
Your discomfort is likely related to a migraine.  You are not having the classic migraines with headache, but otherwise are having symptoms typical of migraine.  These can be more common in pregnancy.  For the discomfort make sure that you are getting plenty of rest, drinking plenty of fluids, and you can use Tylenol if needed for pain.  Follow-up with your obstetrician, as planned for pregnancy care.  Call the neurology office for a follow-up appointment to be seen about migraines.  Return here, if needed, for problems.

## 2016-12-12 NOTE — ED Provider Notes (Signed)
Emergency Department Provider Note   I have reviewed the triage vital signs and the nursing notes.   HISTORY  Chief Complaint Numbness   HPI [name] is a 26 y.o. female with PMH of anxiety presents to the emergency department for evaluation of right leg numbness which was present to upon waking from a nap today. She was normal when going to bed at 1 PM. She describes a "pins and needles" sensation in the right leg which has since resolved. Upon presentation to the emergency department she developed numbness and tingling in the right upper extremity and right torso. Patient is also experiencing some numb sensation in the palm of her left hand. She denies any sudden onset headaches. No vision changes, speech changes, difficulty swallowing. No fevers or chills. She does note taking to home pregnancy test this week which were positive but has not been able to follow with OB as of yet. She denies any family history of early stroke or clotting disorder. She has no history of similar symptoms. No modifying factors other than time. No radiation of symptoms.   Past Medical History:  Diagnosis Date  . Anxiety   . Depressed mood     There are no active problems to display for this patient.   History reviewed. No pertinent surgical history.    Allergies Azithromycin  History reviewed. No pertinent family history.  Social History Social History  Substance Use Topics  . Smoking status: Current Every Day Smoker  . Smokeless tobacco: Never Used  . Alcohol use No    Review of Systems  Constitutional: No fever/chills Eyes: No visual changes. ENT: No sore throat. Cardiovascular: Denies chest pain. Respiratory: Denies shortness of breath. Gastrointestinal: No abdominal pain.  No nausea, no vomiting.  No diarrhea.  No constipation. Genitourinary: Negative for dysuria. Musculoskeletal: Negative for back pain. Skin: Negative for rash. Neurological: Negative for headaches.  RLE numbness and "tingling" (resolved) with residual right torso tingling and resolving RUE and left palm tingling/numbness.   10-point ROS otherwise negative.  ____________________________________________   PHYSICAL EXAM:  VITAL SIGNS: ED Triage Vitals  Enc Vitals Group     BP 12/12/16 1529 109/62     Pulse Rate 12/12/16 1529 74     Resp 12/12/16 1529 18     Temp 12/12/16 1529 97.9 F (36.6 C)     Temp Source 12/12/16 1529 Oral     SpO2 12/12/16 1529 98 %     Weight 12/12/16 1527 260 lb (117.9 kg)     Height 12/12/16 1527  (1.626 m)     Pain Score 12/12/16 1526 5   Constitutional: Alert and oriented. Well appearing and in no acute distress. Eyes: Conjunctivae are normal. PERRL. EOMI. Head: Atraumatic. Nose: No congestion/rhinnorhea. Mouth/Throat: Mucous membranes are moist.  Oropharynx non-erythematous. Neck: No stridor.   Cardiovascular: Normal rate, regular rhythm. Good peripheral circulation. Grossly normal heart sounds.   Respiratory: Normal respiratory effort.  No retractions. Lungs CTAB. Gastrointestinal: Soft and nontender. No distention.  Musculoskeletal: No lower extremity tenderness nor edema. No gross deformities of extremities. Neurologic:  Normal speech and language. No gross focal neurologic deficits are appreciated. Normal CN exam 2-12. No pronator drift. Normal sensation to light touch over RUE and RLE when compared to left. Some reported decreased sensation over the right lateral torso and left palm.  Skin:  Skin is warm, dry and intact. No rash noted.  ____________________________________________   LABS (all labs ordered are listed, but only abnormal  results are displayed)  Labs Reviewed  COMPREHENSIVE METABOLIC PANEL - Abnormal; Notable for the following:       Result Value   Calcium 8.5 (*)    AST 14 (*)    ALT 13 (*)    All other components within normal limits  CBC WITH DIFFERENTIAL/PLATELET - Abnormal; Notable for the following:     Hemoglobin 11.9 (*)    HCT 35.6 (*)    All other components within normal limits  PREGNANCY, URINE - Abnormal; Notable for the following:    Preg Test, Ur POSITIVE (*)    All other components within normal limits  URINALYSIS, ROUTINE W REFLEX MICROSCOPIC - Abnormal; Notable for the following:    Leukocytes, UA TRACE (*)    All other components within normal limits  URINALYSIS, MICROSCOPIC (REFLEX) - Abnormal; Notable for the following:    Bacteria, UA FEW (*)    Squamous Epithelial / LPF 6-30 (*)    All other components within normal limits  TROPONIN I   ____________________________________________  EKG   EKG Interpretation  Date/Time:  Monday December 12 2016 15:39:18 EDT Ventricular Rate:  68 PR Interval:    QRS Duration: 84 QT Interval:  399 QTC Calculation: 425 R Axis:   2 Text Interpretation:  Sinus rhythm No STEMI.  Confirmed by Alona Bene [phone]) on 12/12/2016 3:55:17 PM       ____________________________________________  RADIOLOGY  Ct Head Wo Contrast  Result Date: 12/12/2016 CLINICAL DATA:  Right arm weakness for several hours, initial encounter EXAM: CT HEAD WITHOUT CONTRAST TECHNIQUE: Contiguous axial images were obtained from the base of the skull through the vertex without intravenous contrast. COMPARISON:  None. FINDINGS: Brain: No evidence of acute infarction, hemorrhage, hydrocephalus, extra-axial collection or mass lesion/mass effect. Vascular: No hyperdense vessel or unexpected calcification. Skull: Normal. Negative for fracture or focal lesion. Sinuses/Orbits: No acute finding. Other: None. IMPRESSION: No acute intracranial abnormality noted. Electronically Signed   By: Alcide Clever M.D.   On: 12/12/2016 16:36   Mr Brain Limited Wo Contrast  Result Date: 12/12/2016 CLINICAL DATA:  Initial evaluation for acute right-sided numbness. EXAM: MRI HEAD WITHOUT CONTRAST TECHNIQUE: Multiplanar, multiecho pulse sequences of the brain and surrounding structures were  obtained without intravenous contrast. COMPARISON:  Prior CT from earlier the same day. FINDINGS: Brain: Study limited with only diffusion-weighted sequences and axial FLAIR sequences performed. Cerebral volume normal. Few tiny subcentimeter FLAIR hyperintense foci noted within the periventricular and deep white matter of both cerebral hemispheres, nonspecific, but of doubtful significance. No abnormal foci of restricted diffusion to suggest acute or subacute ischemia. Gray-white matter differentiation maintained. No encephalomalacia to suggest chronic infarction. No mass lesion, midline shift or mass effect. No hydrocephalus. No extra-axial fluid collection. Vascular: Grossly maintained, although not well evaluated on this limited exam. Skull and upper cervical spine: Bone marrow signal intensity grossly normal. No scalp soft tissue abnormality. Craniocervical junction grossly normal, although not well evaluated on this limited exam. Sinuses/Orbits: Globes and orbital soft tissues within normal limits. Paranasal sinuses clear. Trace opacity left mastoid air cells, of doubtful significance. Other: None. IMPRESSION: Limiting exam demonstrating no acute intracranial infarct. MRI appearance of the brain is grossly normal with no other definite acute intracranial abnormality identified. Electronically Signed   By: Rise Mu M.D.   On: 12/12/2016 20:21    ____________________________________________   PROCEDURES  Procedure(s) performed:   Procedures  None ____________________________________________   INITIAL IMPRESSION / ASSESSMENT AND PLAN / ED COURSE  Pertinent labs &  imaging results that were available during my care of the patient were reviewed by me and considered in my medical decision making (see chart for details).  Patient presents to the emergency department for evaluation of evolving tingling and numbness primarily in the right upper and lower extremities but also some  contralateral symptoms and the left palm. Patient is young with few CVA/TIA risk factors. With bilateral symptoms my suspicion for CVA is extremely low. Patient does report recent positive home pregnancy tests. No HA to suggest complex migraine or raise clinical suspicion for venous sinus thrombosis. Patient with exam noted above. No CODE STROKE at this time. Plan for IVF, labs, urine pregnancy, and CT head.   04:45 PM Spoke with Dr. Jerrell Belfast with Neurology. With active pregnancy and continued right torso decreased sensation he advises MRI today in the ED. Call Neurology if positive MRI otherwise the patient can f/u with outpatient Neurology.   05:00 PM Spoke with Dr. Effie Shy at [facility] who accepts the patient in transfer. Plan as above. Recommended EMS transport with active CVA symptoms but offered POV transport with husband driving as well. Patient will go by EMS. [facility] called.  ____________________________________________  FINAL CLINICAL IMPRESSION(S) / ED DIAGNOSES  Final diagnoses:  Stroke-like symptoms  Other migraine without status migrainosus, not intractable     MEDICATIONS GIVEN DURING THIS VISIT:  Medications  sodium chloride 0.9 % bolus 500 mL (0 mLs Intravenous Stopped 12/12/16 1841)     NEW OUTPATIENT MEDICATIONS STARTED DURING THIS VISIT:  There are no discharge medications for this patient.   Note:  This document was prepared using Dragon voice recognition software and may include unintentional dictation errors.  Alona Bene, MD Emergency Medicine    Orlandria Kissner, Arlyss Repress, MD 12/13/16 [phone]

## 2016-12-12 NOTE — ED Triage Notes (Signed)
Patient states that she woke up about an hour ago and her right leg was numb - she reports that she laid down for a nap at 1 pm and her leg was normal. The patient also reports 2 positive pregnancy tests in the last month

## 2016-12-12 NOTE — ED Notes (Signed)
Patient transported to MRI 

## 2016-12-12 NOTE — ED Notes (Signed)
Patient reports weakness on her right arm while triage the patient.

## 2016-12-12 NOTE — Consult Note (Signed)
Neurology Consultation Reason for Consult: Right-sided numbness Referring Physician: Effie Shy, E  CC: Right-sided numbness  History is obtained from: patient   HPI: Ariana Harper is a 26 y.o. female with a history of migraines who presents with right-sided paresthesia. She went to bed for a nap and when she awoke her right leg was tingling. This gradually progressed up the right side and she had some spots in her vision as well. There is mention in notes that she endorsed headache earlier, but she currently denies that she had any.    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    due to these symptoms, she was transferred from [facility] for an MRI. This is been performed which is essentially negative.    ROS: A 14 point ROS was performed and is negative except as noted in the HPI.   Past Medical History:  Diagnosis Date  . Anxiety   . Depressed mood      History reviewed. No pertinent family history.   Social History:  reports that she has been smoking.  She has never used smokeless tobacco. She reports that she does not drink alcohol. Her drug history is not on file.   Exam: Current vital signs: BP (!) 104/52 (BP Location: Right Arm)   Pulse 73   Temp 98.5 F (36.9 C) (Oral)   Resp 20   Ht  (1.626 m)   Wt 117.9 kg (260 lb)   LMP 11/07/2016   SpO2 100%   BMI 44.63 kg/m  Vital signs in last 24 hours: Temp:  [97.9 F (36.6 C)-98.5 F (36.9 C)] 98.5 F (36.9 C) (10/01 1847) Pulse Rate:  [73-75] 73 (10/01 1847) Resp:  [17-20] 20 (10/01 1847) BP: (104-109)/(52-62) 104/52 (10/01 1847) SpO2:  [98 %-100 %] 100 % (10/01 1847) Weight:  [117.9 kg (260 lb)] 117.9 kg (260 lb) (10/01 1527)   Physical Exam  Constitutional: Appears well-developed and well-nourished.  Psych: Affect appropriate to situation Eyes: No scleral injection HENT: No OP obstrucion Head: Normocephalic.  Cardiovascular: Normal rate and regular rhythm.  Respiratory:  Effort normal and breath sounds normal to anterior ascultation GI: Soft.  No distension. There is no tenderness.  Skin: WDI  Neuro: Mental Status: Patient is awake, alert, oriented to person, place, month, year, and situation. Patient is able to give a clear and coherent history. No signs of aphasia or neglect Cranial Nerves: II: Visual Fields are full. Pupils are equal, round, and reactive to light.   III,IV, VI: EOMI without ptosis or diploplia.  V: Facial sensation is symmetric to temperature VII: Facial movement is symmetric.  VIII: hearing is intact to voice X: Uvula elevates symmetrically XI: Shoulder shrug is symmetric. XII: tongue is midline without atrophy or fasciculations.  Motor: Tone is normal. Bulk is normal. 5/5 strength was present in all four extremities.  Sensory: Sensation is symmetric to light touch and temperature in the arms and legs. Cerebellar: FNF intact bilaterally  I have reviewed labs in epic and the results pertinent to this consultation are: CMP-unremarkable   I have reviewed the images obtained: MRI brain-I do think there are 2 subcortical white matter lesions as can be seen with migraine among other causes.  Impression: 27 year old female with migrating paresthesias and visual flashing spots. 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 This can happen during pregnancy. With resolution of her symptoms, this would not  be consistent with multiple sclerosis, spinal cord disease, and the character would be extremely unusual for TIA or stroke.  I would not favor any further workup at this time. It may be reasonable to repeat her MRI at some point to ensure stability of the lesions.  Recommendations: 1) outpatient neurology referral 2) please call if further questions or concerns remain.   Ritta Slot, MD [facility] [phone]  If 7pm- 7am, please page  neurology on call as listed in AMION.

## 2017-03-14 NOTE — L&D Delivery Note (Signed)
Delivery Note Pt reached complete dilation and pushed well.  At 5:21 PM a healthy female was delivered via Vaginal, Spontaneous (Presentation: OA  ).  APGAR: 9, 9; weight pending .   Placenta status: delivered spontaneously .  Cord:  with the following complications:none .    Anesthesia:  epidural Episiotomy: None Lacerations: 2nd degree;Perineal Suture Repair: 2.0 3.0 vicryl rapide Est. Blood Loss (mL): 250mL  Mom to postpartum.  Baby to Couplet care / Skin to Skin.  Oliver PilaKathy W Korryn Pancoast 08/11/2017, 5:55 PM

## 2017-03-21 LAB — OB RESULTS CONSOLE GC/CHLAMYDIA
CHLAMYDIA, DNA PROBE: NEGATIVE
Gonorrhea: NEGATIVE

## 2017-03-21 LAB — OB RESULTS CONSOLE RPR: RPR: NONREACTIVE

## 2017-03-21 LAB — OB RESULTS CONSOLE HIV ANTIBODY (ROUTINE TESTING): HIV: NONREACTIVE

## 2017-03-21 LAB — OB RESULTS CONSOLE ABO/RH: "RH Type ": POSITIVE

## 2017-03-21 LAB — OB RESULTS CONSOLE RUBELLA ANTIBODY, IGM: RUBELLA: IMMUNE

## 2017-03-21 LAB — OB RESULTS CONSOLE HEPATITIS B SURFACE ANTIGEN: HEP B S AG: NEGATIVE

## 2017-03-21 LAB — OB RESULTS CONSOLE ANTIBODY SCREEN: Antibody Screen: NEGATIVE

## 2017-07-06 LAB — OB RESULTS CONSOLE GBS: GBS: NEGATIVE

## 2017-07-28 ENCOUNTER — Telehealth (HOSPITAL_COMMUNITY): Payer: Self-pay | Admitting: *Deleted

## 2017-07-28 ENCOUNTER — Encounter (HOSPITAL_COMMUNITY): Payer: Self-pay | Admitting: *Deleted

## 2017-07-28 NOTE — Telephone Encounter (Signed)
Preadmission screen  

## 2017-08-09 ENCOUNTER — Encounter (HOSPITAL_COMMUNITY): Payer: Self-pay | Admitting: *Deleted

## 2017-08-09 ENCOUNTER — Telehealth (HOSPITAL_COMMUNITY): Payer: Self-pay | Admitting: *Deleted

## 2017-08-09 NOTE — Telephone Encounter (Signed)
Preadmission screen  

## 2017-08-09 NOTE — H&P (Signed)
Ariana Harper is a 27 y.o. female [phone] at 40 weeks (EDD 08/11/17 by LMP c/w 16 week US)presenting for IOL at term. Prenatal care complicated by late entry at 15 weeks. The patient has a h/o anxiety/depression and has taken celexa in past but was abe to come off during pregnancy and did well.   OB History    Gravida  4   Para  1   Term  1   Preterm      AB  2   Living  1     SAB  1   TAB  1   Ectopic      Multiple      Live Births  1         SAB x 1 EAB x 1 NSVD 7#11oz 2013  Past Medical History:  Diagnosis Date  . Anxiety   . Depressed mood   . Depression    PPD  . Dysrhythmia   . Headache    Past Surgical History:  Procedure Laterality Date  . DILATION AND CURETTAGE OF UTERUS    . WISDOM TOOTH EXTRACTION     Family History: family history includes Diabetes in her maternal grandmother; Hypertension in her maternal grandfather. Social History:  reports that she quit smoking about 2 years ago. She has never used smokeless tobacco. She reports that she does not drink alcohol. Her drug history is not on file.     Maternal Diabetes: No Genetic Screening: Normal Maternal Ultrasounds/Referrals: Normal Fetal Ultrasounds or other Referrals:  None Maternal Substance Abuse:  No Significant Maternal Medications:  None Significant Maternal Lab Results:  None Other Comments:  None  ROS History   Last menstrual period 11/04/2016. Exam Physical Exam  Prenatal labs: ABO, Rh: A/Positive/-- (01/08 0000) Antibody: Negative (01/08 0000) Rubella: Immune (01/08 0000) RPR: Nonreactive (01/08 0000)  HBsAg: Negative (01/08 0000)  HIV: Non-reactive (01/08 0000)  GBS: Negative (04/25 0000)  Essential panel negative One hour GCT 79 Tetra screen negative  Assessment/Plan: Pt at term with favorable cervix.  Plan AROM and pitocin.   Oliver Pila 08/09/2017, 10:04 PM

## 2017-08-11 ENCOUNTER — Inpatient Hospital Stay (HOSPITAL_COMMUNITY)
Admission: AD | Admit: 2017-08-11 | Payer: Medicaid Other | Source: Ambulatory Visit | Admitting: Obstetrics and Gynecology

## 2017-08-11 ENCOUNTER — Inpatient Hospital Stay (HOSPITAL_COMMUNITY): Payer: Medicaid Other | Admitting: Anesthesiology

## 2017-08-11 ENCOUNTER — Other Ambulatory Visit: Payer: Self-pay

## 2017-08-11 ENCOUNTER — Inpatient Hospital Stay (HOSPITAL_COMMUNITY)
Admission: RE | Admit: 2017-08-11 | Discharge: 2017-08-13 | DRG: 807 | Disposition: A | Discharge status: Home / Self Care | Payer: Medicaid Other | Attending: Obstetrics and Gynecology | Admitting: Obstetrics and Gynecology

## 2017-08-11 ENCOUNTER — Encounter (HOSPITAL_COMMUNITY): Payer: Self-pay

## 2017-08-11 DIAGNOSIS — Z3A40 40 weeks gestation of pregnancy: Secondary | ICD-10-CM

## 2017-08-11 DIAGNOSIS — Z87891 Personal history of nicotine dependence: Secondary | ICD-10-CM | POA: Diagnosis not present

## 2017-08-11 DIAGNOSIS — Z3483 Encounter for supervision of other normal pregnancy, third trimester: Secondary | ICD-10-CM | POA: Diagnosis present

## 2017-08-11 LAB — RPR: RPR: NONREACTIVE

## 2017-08-11 LAB — CBC
HCT: 32.1 % — ABNORMAL LOW (ref 36.0–46.0)
HEMOGLOBIN: 10.3 g/dL — AB (ref 12.0–15.0)
MCH: 27.5 pg (ref 26.0–34.0)
MCHC: 32.1 g/dL (ref 30.0–36.0)
MCV: 85.8 fL (ref 78.0–100.0)
Platelets: 248 10*3/uL (ref 150–400)
RBC: 3.74 MIL/uL — ABNORMAL LOW (ref 3.87–5.11)
RDW: 15.5 % (ref 11.5–15.5)
WBC: 12.9 10*3/uL — ABNORMAL HIGH (ref 4.0–10.5)

## 2017-08-11 LAB — TYPE AND SCREEN
ABO/RH(D): A POS
Antibody Screen: NEGATIVE

## 2017-08-11 LAB — ABO/RH: ABO/RH(D): A POS

## 2017-08-11 MED ORDER — COCONUT OIL OIL: 1.0000 "application " | TOPICAL_OIL | Status: DC | PRN

## 2017-08-11 MED ORDER — ONDANSETRON HCL 4 MG/2ML IJ SOLN: 4.0000 mg | Freq: Four times a day (QID) | INTRAMUSCULAR | Status: DC | PRN

## 2017-08-11 MED ORDER — BENZOCAINE-MENTHOL 20-0.5 % EX AERO
1.0000 "application " | INHALATION_SPRAY | CUTANEOUS | Status: DC | PRN
  Administered 2017-08-11: 1 via TOPICAL
  Filled 2017-08-11: qty 56

## 2017-08-11 MED ORDER — PHENYLEPHRINE 40 MCG/ML (10ML) SYRINGE FOR IV PUSH (FOR BLOOD PRESSURE SUPPORT): 80.0000 ug | PREFILLED_SYRINGE | INTRAVENOUS | Status: DC | PRN

## 2017-08-11 MED ORDER — OXYTOCIN BOLUS FROM INFUSION
500.0000 mL | Freq: Once | INTRAVENOUS | Status: AC
  Administered 2017-08-11: 500 mL via INTRAVENOUS

## 2017-08-11 MED ORDER — PHENYLEPHRINE 40 MCG/ML (10ML) SYRINGE FOR IV PUSH (FOR BLOOD PRESSURE SUPPORT)
PREFILLED_SYRINGE | INTRAVENOUS | Status: AC
  Filled 2017-08-11: qty 20

## 2017-08-11 MED ORDER — EPHEDRINE 5 MG/ML INJ: 10.0000 mg | INTRAVENOUS | Status: DC | PRN

## 2017-08-11 MED ORDER — DIPHENHYDRAMINE HCL 50 MG/ML IJ SOLN: 12.5000 mg | INTRAMUSCULAR | Status: DC | PRN

## 2017-08-11 MED ORDER — FENTANYL CITRATE (PF) 100 MCG/2ML IJ SOLN: 50.0000 ug | INTRAMUSCULAR | Status: DC | PRN

## 2017-08-11 MED ORDER — OXYCODONE-ACETAMINOPHEN 5-325 MG PO TABS: 1.0000 | ORAL_TABLET | ORAL | Status: DC | PRN

## 2017-08-11 MED ORDER — LIDOCAINE HCL (PF) 1 % IJ SOLN
INTRAMUSCULAR | Status: DC | PRN
  Administered 2017-08-11: 5 mL via EPIDURAL

## 2017-08-11 MED ORDER — PRENATAL MULTIVITAMIN CH
1.0000 | ORAL_TABLET | Freq: Every day | ORAL | Status: DC
  Administered 2017-08-12: 1 via ORAL
  Filled 2017-08-11: qty 1

## 2017-08-11 MED ORDER — LACTATED RINGERS IV SOLN: 500.0000 mL | Freq: Once | INTRAVENOUS | Status: DC

## 2017-08-11 MED ORDER — SENNOSIDES-DOCUSATE SODIUM 8.6-50 MG PO TABS
2.0000 | ORAL_TABLET | ORAL | Status: DC
  Administered 2017-08-11 – 2017-08-12 (×2): 2 via ORAL
  Filled 2017-08-11 (×2): qty 2

## 2017-08-11 MED ORDER — SOD CITRATE-CITRIC ACID 500-334 MG/5ML PO SOLN: 30.0000 mL | ORAL | Status: DC | PRN

## 2017-08-11 MED ORDER — SIMETHICONE 80 MG PO CHEW: 80.0000 mg | CHEWABLE_TABLET | ORAL | Status: DC | PRN

## 2017-08-11 MED ORDER — ONDANSETRON HCL 4 MG/2ML IJ SOLN: 4.0000 mg | INTRAMUSCULAR | Status: DC | PRN

## 2017-08-11 MED ORDER — LACTATED RINGERS IV SOLN
500.0000 mL | Freq: Once | INTRAVENOUS | Status: AC
  Administered 2017-08-11: 500 mL via INTRAVENOUS

## 2017-08-11 MED ORDER — ACETAMINOPHEN 325 MG PO TABS: 650.0000 mg | ORAL_TABLET | ORAL | Status: DC | PRN

## 2017-08-11 MED ORDER — LACTATED RINGERS IV SOLN: 500.0000 mL | INTRAVENOUS | Status: DC | PRN

## 2017-08-11 MED ORDER — DIPHENHYDRAMINE HCL 25 MG PO CAPS: 25.0000 mg | ORAL_CAPSULE | Freq: Four times a day (QID) | ORAL | Status: DC | PRN

## 2017-08-11 MED ORDER — WITCH HAZEL-GLYCERIN EX PADS: 1.0000 "application " | MEDICATED_PAD | CUTANEOUS | Status: DC | PRN

## 2017-08-11 MED ORDER — OXYTOCIN 40 UNITS IN LACTATED RINGERS INFUSION - SIMPLE MED: 2.5000 [IU]/h | INTRAVENOUS | Status: DC

## 2017-08-11 MED ORDER — LIDOCAINE HCL (PF) 1 % IJ SOLN
30.0000 mL | INTRAMUSCULAR | Status: DC | PRN
  Administered 2017-08-11: 30 mL via SUBCUTANEOUS
  Filled 2017-08-11: qty 30

## 2017-08-11 MED ORDER — FENTANYL 2.5 MCG/ML BUPIVACAINE 1/10 % EPIDURAL INFUSION (WH - ANES)
INTRAMUSCULAR | Status: AC
  Filled 2017-08-11: qty 100

## 2017-08-11 MED ORDER — LACTATED RINGERS IV SOLN
INTRAVENOUS | Status: DC
  Administered 2017-08-11 (×2): via INTRAVENOUS

## 2017-08-11 MED ORDER — ZOLPIDEM TARTRATE 5 MG PO TABS: 5.0000 mg | ORAL_TABLET | Freq: Every evening | ORAL | Status: DC | PRN

## 2017-08-11 MED ORDER — FENTANYL 2.5 MCG/ML BUPIVACAINE 1/10 % EPIDURAL INFUSION (WH - ANES)
14.0000 mL/h | INTRAMUSCULAR | Status: DC | PRN
  Administered 2017-08-11: 14 mL/h via EPIDURAL

## 2017-08-11 MED ORDER — TETANUS-DIPHTH-ACELL PERTUSSIS 5-2.5-18.5 LF-MCG/0.5 IM SUSP: 0.5000 mL | Freq: Once | INTRAMUSCULAR | Status: DC

## 2017-08-11 MED ORDER — OXYTOCIN 40 UNITS IN LACTATED RINGERS INFUSION - SIMPLE MED
1.0000 m[IU]/min | INTRAVENOUS | Status: DC
  Administered 2017-08-11: 2 m[IU]/min via INTRAVENOUS
  Administered 2017-08-11: 22 m[IU]/min via INTRAVENOUS
  Filled 2017-08-11: qty 1000

## 2017-08-11 MED ORDER — ONDANSETRON HCL 4 MG PO TABS: 4.0000 mg | ORAL_TABLET | ORAL | Status: DC | PRN

## 2017-08-11 MED ORDER — TERBUTALINE SULFATE 1 MG/ML IJ SOLN: 0.2500 mg | Freq: Once | INTRAMUSCULAR | Status: DC | PRN

## 2017-08-11 MED ORDER — DIBUCAINE 1 % RE OINT: 1.0000 "application " | TOPICAL_OINTMENT | RECTAL | Status: DC | PRN

## 2017-08-11 MED ORDER — IBUPROFEN 600 MG PO TABS
600.0000 mg | ORAL_TABLET | Freq: Four times a day (QID) | ORAL | Status: DC
  Administered 2017-08-11 – 2017-08-13 (×6): 600 mg via ORAL
  Filled 2017-08-11 (×6): qty 1

## 2017-08-11 MED ORDER — OXYCODONE-ACETAMINOPHEN 5-325 MG PO TABS: 2.0000 | ORAL_TABLET | ORAL | Status: DC | PRN

## 2017-08-11 NOTE — Anesthesia Procedure Notes (Signed)
Epidural Patient location during procedure: OB Start time: 08/11/2017 2:44 PM End time: 08/11/2017 2:50 PM  Staffing Anesthesiologist: Shelton Silvas, MD Performed: anesthesiologist   Preanesthetic Checklist Completed: patient identified, site marked, surgical consent, pre-op evaluation, timeout performed, IV checked, risks and benefits discussed and monitors and equipment checked  Epidural Patient position: sitting Prep: ChloraPrep Patient monitoring: heart rate, continuous pulse ox and blood pressure Approach: midline Location: L3-L4 Injection technique: LOR saline  Needle:  Needle type: Tuohy  Needle gauge: 17 G Needle length: 9 cm Catheter type: closed end flexible Catheter size: 20 Guage Test dose: negative and 1.5% lidocaine  Assessment Events: blood not aspirated, injection not painful, no injection resistance and no paresthesia  Additional Notes LOR @ 6  Patient identified. Risks/Benefits/Options discussed with patient including but not limited to bleeding, infection, nerve damage, paralysis, failed block, incomplete pain control, headache, blood pressure changes, nausea, vomiting, reactions to medications, itching and postpartum back pain. Confirmed with bedside nurse the patient's most recent platelet count. Confirmed with patient that they are not currently taking any anticoagulation, have any bleeding history or any family history of bleeding disorders. Patient expressed understanding and wished to proceed. All questions were answered. Sterile technique was used throughout the entire procedure. Please see nursing notes for vital signs. Test dose was given through epidural catheter and negative prior to continuing to dose epidural or start infusion. Warning signs of high block given to the patient including shortness of breath, tingling/numbness in hands, complete motor block, or any concerning symptoms with instructions to call for help. Patient was given instructions on  fall risk and not to get out of bed. All questions and concerns addressed with instructions to call with any issues or inadequate analgesia.    Reason for block:procedure for pain

## 2017-08-11 NOTE — Anesthesia Pain Management Evaluation Note (Signed)
  CRNA Pain Management Visit Note  Patient: Ariana Harper, 27 y.o., female  "Hello I am a member of the anesthesia team at [facility]. We have an anesthesia team available at all times to provide care throughout the hospital, including epidural management and anesthesia for C-section. I don't know your plan for the delivery whether it a natural birth, water birth, IV sedation, nitrous supplementation, doula or epidural, but we want to meet your pain goals."   1.Was your pain managed to your expectations on prior hospitalizations?   Yes   2.What is your expectation for pain management during this hospitalization?     Epidural  3.How can we help you reach that goal? Epidural  Record the patient's initial score and the patient's pain goal.   Pain: 0  Pain Goal: 7 [facility] wants you to be able to say your pain was always managed very well.  Rica Records 08/11/2017

## 2017-08-11 NOTE — Progress Notes (Signed)
Patient ID: Ariana Harper, female   DOB: 07-14-1990, 27 y.o.   MRN: [id number] Contractions increasing in intensity but not severe yet  FHR category 1  Cervix 4/60/-1  Continue to increase pitocin until in active labor Follow progress

## 2017-08-11 NOTE — Progress Notes (Signed)
Patient ID: Ariana Harper, female   DOB: 1991/03/03, 27 y.o.   MRN: [id number] Pt admitted and on pitocin feeling mild contractions  afeb VSS  Cervix 50/3/-2 AROM clear, some bloody show from cervix  FHR category 1--baseline 130  Planning epidural Follow progress

## 2017-08-11 NOTE — Anesthesia Preprocedure Evaluation (Signed)
Anesthesia Evaluation  Patient identified by MRN, date of birth, ID band Patient awake    Reviewed: Allergy & Precautions, Patient's Chart, lab work & pertinent test results  Airway Mallampati: III       Dental no notable dental hx.    Pulmonary former smoker,    Pulmonary exam normal        Cardiovascular + dysrhythmias  Rhythm:Regular Rate:Normal     Neuro/Psych  Headaches, PSYCHIATRIC DISORDERS Anxiety Depression    GI/Hepatic negative GI ROS, Neg liver ROS,   Endo/Other  negative endocrine ROS  Renal/GU negative Renal ROS     Musculoskeletal negative musculoskeletal ROS (+)   Abdominal   Peds  Hematology negative hematology ROS (+)   Anesthesia Other Findings   Reproductive/Obstetrics (+) Pregnancy                             Anesthesia Physical Anesthesia Plan  ASA: III  Anesthesia Plan: Epidural   Post-op Pain Management:    Induction:   PONV Risk Score and Plan:   Airway Management Planned: Natural Airway  Additional Equipment:   Intra-op Plan:   Post-operative Plan:   Informed Consent: I have reviewed the patients History and Physical, chart, labs and discussed the procedure including the risks, benefits and alternatives for the proposed anesthesia with the patient or authorized representative who has indicated his/her understanding and acceptance.     Plan Discussed with:   Anesthesia Plan Comments: (Lab Results      Component                Value               Date                      WBC                      12.9 (H)            08/11/2017                HGB                      10.3 (L)            08/11/2017                HCT                      32.1 (L)            08/11/2017                MCV                      85.8                08/11/2017                PLT                      248                 08/11/2017           )        Anesthesia  Quick Evaluation

## 2017-08-12 LAB — CBC
HCT: 29.3 % — ABNORMAL LOW (ref 36.0–46.0)
HEMOGLOBIN: 9.5 g/dL — AB (ref 12.0–15.0)
MCH: 27.9 pg (ref 26.0–34.0)
MCHC: 32.4 g/dL (ref 30.0–36.0)
MCV: 86.2 fL (ref 78.0–100.0)
Platelets: 215 10*3/uL (ref 150–400)
RBC: 3.4 MIL/uL — ABNORMAL LOW (ref 3.87–5.11)
RDW: 15.6 % — ABNORMAL HIGH (ref 11.5–15.5)
WBC: 12 10*3/uL — AB (ref 4.0–10.5)

## 2017-08-12 NOTE — Anesthesia Postprocedure Evaluation (Signed)
Anesthesia Post Note  Patient: Addison LankStephanie A Thornley  Procedure(s) Performed: AN AD HOC LABOR EPIDURAL     Patient location during evaluation: Mother Baby Anesthesia Type: Epidural Level of consciousness: awake and alert and oriented Pain management: satisfactory to patient Vital Signs Assessment: post-procedure vital signs reviewed and stable Respiratory status: respiratory function stable Cardiovascular status: stable Postop Assessment: no headache, no backache, epidural receding, patient able to bend at knees, no signs of nausea or vomiting and adequate PO intake Anesthetic complications: no    Last Vitals:  Vitals:   08/12/17 0202 08/12/17 0900  BP: (!) 98/56 (!) 98/53  Pulse: (!) 10 72  Resp: 18 20  Temp: 36.6 C 36.7 C  SpO2:      Last Pain:  Vitals:   08/12/17 0954  TempSrc:   PainSc: 0-No pain   Pain Goal:                 Shajuan Musso

## 2017-08-12 NOTE — Progress Notes (Signed)
CSW received consult for patient due to having a history of postpartum depression. CSW met with patient and newborn, Ariana Harper to discuss mental health history. Patient stated that she had a few episodes of extreme sadness following last delivery but used self coping mechanisms to deal with symptoms. Patient had a brief time period where she was on Celexa, but discontinued taking it. Patient is not currently on any psychtropic medications. CSW and patient discussed PMADS and patient stated she felt comfortable reaching out to her PCP/OBGYN if needs arise. CSW encouraged patient to reach out to [facility] if any additional questions or concerns arise.  Madilyn Fireman, MSW, [place] Social Worker [facility] [phone]

## 2017-08-12 NOTE — Progress Notes (Signed)
Post Partum Day 1 Subjective: Pt c/o mild dizziness when got up to restroom this AM.  Bleeding normal and pain not more than cramps  Objective: Blood pressure (!) 98/53, pulse 72, temperature 98 F (36.7 C), temperature source Oral, resp. rate 20, height 5\' 4"  (1.626 m), weight 122 kg (269 lb), last menstrual period 11/04/2016, SpO2 100 %, unknown if currently breastfeeding.  Physical Exam:  General: alert and cooperative Lochia: appropriate Uterine Fundus: firm   Recent Labs    08/11/17 0715 08/12/17 0550  HGB 10.3* 9.5*  HCT 32.1* 29.3*    Assessment/Plan: Plan for discharge tomorrow  Encourage hydration, Hgb stable Will call RN to assit to restroom next time to ensure no significant problems   LOS: 1 day   Oliver PilaKathy W Awilda Covin 08/12/2017, 10:07 AM

## 2017-08-13 MED ORDER — ACETAMINOPHEN 325 MG PO TABS: 650.0000 mg | ORAL_TABLET | ORAL | 1 refills | Status: DC | PRN

## 2017-08-13 MED ORDER — IBUPROFEN 600 MG PO TABS: 600.0000 mg | ORAL_TABLET | Freq: Four times a day (QID) | ORAL | 0 refills | Status: DC

## 2017-08-13 NOTE — Discharge Summary (Signed)
OB Discharge Summary     Patient Name: Ariana Harper DOB: 1990-04-16 MRN: [id number]  Date of admission: 08/11/2017 Delivering MD: Huel CoteICHARDSON, Maxxwell Edgett   Date of discharge: 08/13/2017  Admitting diagnosis: INDUCCTION Intrauterine pregnancy: 6425w0d     Secondary diagnosis:  Active Problems:   Indication for care in labor and delivery, antepartum   NSVD (normal spontaneous vaginal delivery)  Additional problems: none     Discharge diagnosis: Term Pregnancy Delivered                                                                                                Post partum procedures:none  Augmentation: AROM and Pitocin  Complications: None  Hospital course:  Induction of Labor With Vaginal Delivery   27 y.o. yo [id number] at 1225w0d was admitted to the hospital 08/11/2017 for induction of labor.  Indication for induction: Favorable cervix at term.  Patient had an uncomplicated labor course as follows: Membrane Rupture Time/Date: 9:13 AM ,08/11/2017   Intrapartum Procedures: Episiotomy: None [1]                                         Lacerations:  2nd degree [3];Perineal [11]  Patient had delivery of a Viable infant.  Information for the patient's newborn:  Ariana Harper, Girl Sarissa [[id number]][[id number]]  Delivery Method: Vaginal, Spontaneous(Filed from Delivery Summary)   08/11/2017  Details of delivery can be found in separate delivery note.  Patient had a routine postpartum course. Patient is discharged home 08/13/17.  Physical exam  Vitals:   08/12/17 0900 08/12/17 1758 08/12/17 2115 08/13/17 0504  BP: (!) 98/53 123/70 131/64 125/80  Pulse: 72 74 71 65  Resp: 20 20 18 18   Temp: 98 F (36.7 C) 98.2 F (36.8 C) 98.7 F (37.1 C) 98 F (36.7 C)  TempSrc: Oral Oral Oral Oral  SpO2:      Weight:      Height:       General: alert and cooperative Lochia: appropriate Uterine Fundus: firm  Labs: Lab Results  Component Value Date   WBC 12.0 (H) 08/12/2017   HGB 9.5 (L) 08/12/2017   HCT 29.3 (L) 08/12/2017   MCV 86.2 08/12/2017   PLT 215 08/12/2017   CMP Latest Ref Rng & Units 12/12/2016  Glucose 65 - 99 mg/dL 99  BUN 6 - 20 mg/dL 8  Creatinine 1.300.44 - 8.651.00 mg/dL 7.840.74  Sodium 696135 - 295145 mmol/L 136  Potassium 3.5 - 5.1 mmol/L 3.7  Chloride 101 - 111 mmol/L 106  CO2 22 - 32 mmol/L 24  Calcium 8.9 - 10.3 mg/dL 2.8(U8.5(L)  Total Protein 6.5 - 8.1 g/dL 7.1  Total Bilirubin 0.3 - 1.2 mg/dL 0.4  Alkaline Phos 38 - 126 U/L 59  AST 15 - 41 U/L 14(L)  ALT 14 - 54 U/L 13(L)    Discharge instruction: per After Visit Summary and "Baby and Me Booklet".  After visit meds:  Allergies as of 08/13/2017      Reactions   Azithromycin  Nausea Only, Other (See Comments)   Stomach cramps GI Upset      Medication List    TAKE these medications   acetaminophen 325 MG tablet Commonly known as:  TYLENOL Take 2 tablets (650 mg total) by mouth every 4 (four) hours as needed (for pain scale < 4).   ibuprofen 600 MG tablet Commonly known as:  ADVIL,MOTRIN Take 1 tablet (600 mg total) by mouth every 6 (six) hours.   prenatal multivitamin Tabs tablet Take 1 tablet by mouth daily at 12 noon.       Diet: routine diet  Activity: Advance as tolerated. Pelvic rest for 6 weeks.   Outpatient follow up:6 weeks Follow up Appt:No future appointments. Follow up Visit:No follow-ups on file.  Postpartum contraception: Undecided  Newborn Data: Live born female  Birth Weight: 7 lb 9.7 oz (3450 g) APGAR: 9, 9  Newborn Delivery   Birth date/time:  08/11/2017 17:21:00 Delivery type:  Vaginal, Spontaneous     Baby Feeding: Bottle Disposition:home with mother   08/13/2017 Oliver Pila, MD

## 2017-08-13 NOTE — Progress Notes (Signed)
Post Partum Day 2 Subjective: no complaints and tolerating PO  Objective: Blood pressure 125/80, pulse 65, temperature 98 F (36.7 C), temperature source Oral, resp. rate 18, height 5\' 4"  (1.626 m), weight 122 kg (269 lb), last menstrual period 11/04/2016, SpO2 100 %, unknown if currently breastfeeding.  Physical Exam:  General: alert and cooperative Lochia: appropriate Uterine Fundus: firm   Recent Labs    08/11/17 0715 08/12/17 0550  HGB 10.3* 9.5*  HCT 32.1* 29.3*    Assessment/Plan: Discharge home   LOS: 2 days   Oliver PilaKathy W Chrislynn Mosely 08/13/2017, 9:29 AM

## 2018-01-17 ENCOUNTER — Emergency Department (HOSPITAL_BASED_OUTPATIENT_CLINIC_OR_DEPARTMENT_OTHER)
Admission: EM | Admit: 2018-01-17 | Discharge: 2018-01-17 | Disposition: A | Discharge status: Home / Self Care | Payer: Medicaid Other | Attending: Emergency Medicine | Admitting: Emergency Medicine

## 2018-01-17 ENCOUNTER — Encounter (HOSPITAL_BASED_OUTPATIENT_CLINIC_OR_DEPARTMENT_OTHER): Payer: Self-pay | Admitting: Emergency Medicine

## 2018-01-17 ENCOUNTER — Other Ambulatory Visit: Payer: Self-pay

## 2018-01-17 DIAGNOSIS — Z87891 Personal history of nicotine dependence: Secondary | ICD-10-CM | POA: Insufficient documentation

## 2018-01-17 DIAGNOSIS — G43009 Migraine without aura, not intractable, without status migrainosus: Secondary | ICD-10-CM

## 2018-01-17 MED ORDER — DIPHENHYDRAMINE HCL 25 MG PO CAPS
25.0000 mg | ORAL_CAPSULE | Freq: Once | ORAL | Status: AC
  Administered 2018-01-17: 25 mg via ORAL
  Filled 2018-01-17: qty 1

## 2018-01-17 MED ORDER — PROCHLORPERAZINE MALEATE 10 MG PO TABS
10.0000 mg | ORAL_TABLET | Freq: Once | ORAL | Status: AC
  Administered 2018-01-17: 10 mg via ORAL
  Filled 2018-01-17: qty 1

## 2018-01-17 MED ORDER — METOCLOPRAMIDE HCL 10 MG PO TABS
10.0000 mg | ORAL_TABLET | Freq: Once | ORAL | Status: DC
  Filled 2018-01-17: qty 1

## 2018-01-17 MED ORDER — DEXAMETHASONE 6 MG PO TABS
10.0000 mg | ORAL_TABLET | Freq: Once | ORAL | Status: AC
  Administered 2018-01-17: 10 mg via ORAL
  Filled 2018-01-17: qty 1

## 2018-01-17 NOTE — ED Notes (Signed)
Pt verbalizes understanding of d/c instructions and denies any further needs at this time. 

## 2018-01-17 NOTE — ED Triage Notes (Addendum)
Pt states she was driving her car on the way home and noticed some vision changes.  Pt states she started with headache about 10 minutes ago.  Pt admits to having migraines in the past but have never presented this way.  Vision has improved but feels pressure on her left eye.  Pt also states that she has been having cold symptoms prior to today.

## 2018-01-17 NOTE — ED Provider Notes (Signed)
[facility] EMERGENCY DEPARTMENT Provider Note   CSN: [id number] Arrival date & time: 01/17/18  1526     History   Chief Complaint Chief Complaint  Patient presents with  . Headache    HPI ALETTE KATAOKA is a 27 y.o. female.  The history is provided by the patient.  Headache   This is a new problem. The current episode started 6 to 12 hours ago. The problem has been gradually worsening. The headache is associated with emotional stress. The pain is located in the bilateral region. The quality of the pain is described as dull. The pain is at a severity of 3/10. The pain is mild. The pain does not radiate. Associated symptoms include shortness of breath. Pertinent negatives include no anorexia, no fever, no malaise/fatigue, no chest pressure, no near-syncope, no orthopnea, no palpitations, no nausea and no vomiting. She has tried nothing for the symptoms. The treatment provided no relief.    Past Medical History:  Diagnosis Date  . Anxiety   . Depressed mood   . Depression    PPD  . Dysrhythmia   . Headache     Patient Active Problem List   Diagnosis Date Noted  . Indication for care in labor and delivery, antepartum 08/11/2017  . NSVD (normal spontaneous vaginal delivery) 08/11/2017    Past Surgical History:  Procedure Laterality Date  . DILATION AND CURETTAGE OF UTERUS    . WISDOM TOOTH EXTRACTION       OB History    Gravida  4   Para  2   Term  2   Preterm      AB  2   Living  2     SAB  1   TAB  1   Ectopic      Multiple  0   Live Births  2            Home Medications    Prior to Admission medications   Not on File    Family History Family History  Problem Relation Age of Onset  . Diabetes Maternal Grandmother   . Hypertension Maternal Grandfather     Social History Social History   Tobacco Use  . Smoking status: Former Smoker    Last attempt to quit: 08/10/2015    Years since quitting: 2.4  . Smokeless  tobacco: Never Used  Substance Use Topics  . Alcohol use: No  . Drug use: Not on file     Allergies   Azithromycin   Review of Systems Review of Systems  Constitutional: Negative for chills, fever and malaise/fatigue.  HENT: Negative for ear pain and sore throat.   Eyes: Positive for visual disturbance. Negative for pain.  Respiratory: Positive for shortness of breath. Negative for cough.   Cardiovascular: Negative for chest pain, palpitations, orthopnea and near-syncope.  Gastrointestinal: Negative for abdominal pain, anorexia, nausea and vomiting.  Genitourinary: Negative for dysuria and hematuria.  Musculoskeletal: Negative for arthralgias and back pain.  Skin: Negative for color change and rash.  Neurological: Positive for headaches. Negative for seizures and syncope.  All other systems reviewed and are negative.    Physical Exam Updated Vital Signs  ED Triage Vitals  Enc Vitals Group     BP 01/17/18 1530 122/76     Pulse Rate 01/17/18 1530 79     Resp 01/17/18 1530 18     Temp 01/17/18 1530 98.5 F (36.9 C)     Temp Source 01/17/18 1530 Oral  SpO2 01/17/18 1530 100 %     Weight 01/17/18 1530 260 lb (117.9 kg)     Height 01/17/18 1530 5\' 3"  (1.6 m)     Head Circumference --      Peak Flow --      Pain Score 01/17/18 1536 3     Pain Loc --      Pain Edu? --      Excl. in GC? --     Physical Exam  Constitutional: She is oriented to person, place, and time. She appears well-developed and well-nourished. No distress.  HENT:  Head: Normocephalic and atraumatic.  Eyes: Pupils are equal, round, and reactive to light. Conjunctivae and EOM are normal. Right eye exhibits normal extraocular motion and no nystagmus. Left eye exhibits normal extraocular motion and no nystagmus. Right pupil is round and reactive. Left pupil is round and reactive.  20/20 vision b/l   Neck: Normal range of motion. Neck supple.  Cardiovascular: Normal rate, regular rhythm, normal heart  sounds and intact distal pulses.  No murmur heard. Pulmonary/Chest: Effort normal and breath sounds normal. No respiratory distress.  Abdominal: Soft. There is no tenderness.  Musculoskeletal: She exhibits no edema.  Neurological: She is alert and oriented to person, place, and time. She has normal strength. She is not disoriented. No cranial nerve deficit or sensory deficit. Coordination normal.  Skin: Skin is warm and dry.  Psychiatric: She has a normal mood and affect.  Nursing note and vitals reviewed.    ED Treatments / Results  Labs (all labs ordered are listed, but only abnormal results are displayed) Labs Reviewed - No data to display  EKG None  Radiology No results found.  Procedures Procedures (including critical care time)  Medications Ordered in ED Medications  metoCLOPramide (REGLAN) tablet 10 mg (has no administration in time range)  diphenhydrAMINE (BENADRYL) capsule 25 mg (has no administration in time range)  dexamethasone (DECADRON) tablet 10 mg (has no administration in time range)     Initial Impression / Assessment and Plan / ED Course  I have reviewed the triage vital signs and the nursing notes.  Pertinent labs & imaging results that were available during my care of the patient were reviewed by me and considered in my medical decision making (see chart for details).     ANICIA LEUTHOLD is a 27 year old female with history of headaches who presents to the ED with headache.  Patient with normal vitals.  No fever.  Patient with aura while driving with visual disturbance that has now resolved and then headache has slowly started.  She has not taken any Motrin or Tylenol.  Denies this being the worst headache of her life.  Has had some cold-like symptoms.  But no signs of meningitis on exam.  Normal neurological exam.  Normal vision.  No concern for retinal detachment.  No concern for intracranial involvement.  Likely migraine headache with aura.  Patient  describes scotomas.  Has had headaches for a long time and has had different auras before but no ocular auras in the past.  Patient otherwise overall well-appearing.  Given reassurance.  Given information to follow-up with primary care doctor.  Given headache cocktail of Compazine, Benadryl, Decadron.  Discharged from ED in good condition and told her return to ED if symptoms worsen.  This chart was dictated using voice recognition software.  Despite best efforts to proofread,  errors can occur which can change the documentation meaning.  Final Clinical Impressions(s) / ED  Diagnoses   Final diagnoses:  Migraine without aura and without status migrainosus, not intractable    ED Discharge Orders    None       Virgina Norfolk, DO 01/17/18 1557

## 2018-03-26 ENCOUNTER — Encounter (HOSPITAL_BASED_OUTPATIENT_CLINIC_OR_DEPARTMENT_OTHER): Payer: Self-pay | Admitting: Emergency Medicine

## 2018-03-26 ENCOUNTER — Emergency Department (HOSPITAL_BASED_OUTPATIENT_CLINIC_OR_DEPARTMENT_OTHER)
Admission: EM | Admit: 2018-03-26 | Discharge: 2018-03-26 | Disposition: A | Discharge status: Home / Self Care | Payer: Medicaid Other | Attending: Emergency Medicine | Admitting: Emergency Medicine

## 2018-03-26 ENCOUNTER — Other Ambulatory Visit: Payer: Self-pay

## 2018-03-26 ENCOUNTER — Encounter (HOSPITAL_BASED_OUTPATIENT_CLINIC_OR_DEPARTMENT_OTHER): Payer: Self-pay | Admitting: *Deleted

## 2018-03-26 DIAGNOSIS — R509 Fever, unspecified: Secondary | ICD-10-CM | POA: Insufficient documentation

## 2018-03-26 DIAGNOSIS — J020 Streptococcal pharyngitis: Secondary | ICD-10-CM | POA: Insufficient documentation

## 2018-03-26 DIAGNOSIS — J029 Acute pharyngitis, unspecified: Secondary | ICD-10-CM | POA: Insufficient documentation

## 2018-03-26 DIAGNOSIS — Z87891 Personal history of nicotine dependence: Secondary | ICD-10-CM | POA: Insufficient documentation

## 2018-03-26 DIAGNOSIS — M7918 Myalgia, other site: Secondary | ICD-10-CM | POA: Insufficient documentation

## 2018-03-26 DIAGNOSIS — R05 Cough: Secondary | ICD-10-CM | POA: Insufficient documentation

## 2018-03-26 DIAGNOSIS — Z5321 Procedure and treatment not carried out due to patient leaving prior to being seen by health care provider: Secondary | ICD-10-CM | POA: Insufficient documentation

## 2018-03-26 LAB — GROUP A STREP BY PCR: Group A Strep by PCR: DETECTED — AB

## 2018-03-26 MED ORDER — PENICILLIN G BENZATHINE 1200000 UNIT/2ML IM SUSP
1.2000 10*6.[IU] | Freq: Once | INTRAMUSCULAR | Status: AC
  Administered 2018-03-26: 1.2 10*6.[IU] via INTRAMUSCULAR
  Filled 2018-03-26: qty 2

## 2018-03-26 MED ORDER — HYDROCODONE-ACETAMINOPHEN 5-325 MG PO TABS: 1.0000 | ORAL_TABLET | Freq: Four times a day (QID) | ORAL | 0 refills | Status: AC | PRN

## 2018-03-26 MED ORDER — HYDROCODONE-ACETAMINOPHEN 5-325 MG PO TABS: 1.0000 | ORAL_TABLET | Freq: Four times a day (QID) | ORAL | 0 refills | Status: DC | PRN

## 2018-03-26 MED ORDER — DEXAMETHASONE 6 MG PO TABS
6.0000 mg | ORAL_TABLET | Freq: Once | ORAL | Status: AC
  Administered 2018-03-26: 6 mg via ORAL
  Filled 2018-03-26: qty 1

## 2018-03-26 NOTE — ED Triage Notes (Signed)
Sore throat, body aches and fever since last night.

## 2018-03-26 NOTE — ED Notes (Signed)
Called x 2 with no answer in waiting area.

## 2018-03-26 NOTE — ED Notes (Signed)
Registration called patient at home-patient states she did not have anyone to watch her child and left to wait for her husband.

## 2018-03-26 NOTE — ED Notes (Signed)
Pt teaching provided on medications that may cause drowsiness. Pt instructed not to drive or operate heavy machinery while taking the prescribed medication. Pt verbalized understanding.   

## 2018-03-26 NOTE — ED Triage Notes (Signed)
Reports sore throat, generalized body aches, vomiting and fever which began last night.

## 2018-03-26 NOTE — ED Provider Notes (Signed)
[facility] EMERGENCY DEPARTMENT Provider Note   CSN: [id number] Arrival date & time: 03/26/18  1836     History   Chief Complaint Chief Complaint  Patient presents with  . Sore Throat    HPI Ariana Harper is a 28 y.o. female.  HPI Patient presents with sore throats.  Began early this morning.  Has had a cough with aches and fevers at home.  No definite sick contacts with states her kids have been coughing but does not have a sore throat.  No difficulty breathing.  No difficulty swallowing but does have some pain with swallowing. Past Medical History:  Diagnosis Date  . Anxiety   . Depressed mood   . Depression    PPD  . Dysrhythmia   . Headache     Patient Active Problem List   Diagnosis Date Noted  . Indication for care in labor and delivery, antepartum 08/11/2017  . NSVD (normal spontaneous vaginal delivery) 08/11/2017    Past Surgical History:  Procedure Laterality Date  . DILATION AND CURETTAGE OF UTERUS    . WISDOM TOOTH EXTRACTION       OB History    Gravida  4   Para  2   Term  2   Preterm      AB  2   Living  2     SAB  1   TAB  1   Ectopic      Multiple  0   Live Births  2            Home Medications    Prior to Admission medications   Medication Sig Start Date End Date Taking? Authorizing Provider  HYDROcodone-acetaminophen (NORCO/VICODIN) 5-325 MG tablet Take 1-2 tablets by mouth every 6 (six) hours as needed. 03/26/18   Benjiman Core, MD    Family History Family History  Problem Relation Age of Onset  . Diabetes Maternal Grandmother   . Hypertension Maternal Grandfather     Social History Social History   Tobacco Use  . Smoking status: Former Smoker    Last attempt to quit: 08/10/2015    Years since quitting: 2.6  . Smokeless tobacco: Never Used  Substance Use Topics  . Alcohol use: No  . Drug use: Not on file     Allergies   Azithromycin   Review of Systems Review of Systems    Constitutional: Positive for appetite change and fever.  HENT: Positive for sore throat. Negative for trouble swallowing.   Respiratory: Positive for cough.   Cardiovascular: Negative for chest pain.  Gastrointestinal: Negative for abdominal pain.  Genitourinary: Negative for flank pain.  Musculoskeletal: Positive for myalgias. Negative for back pain.  Skin: Negative for rash.  Neurological: Negative for weakness.  Hematological: Negative for adenopathy.  Psychiatric/Behavioral: Negative for confusion.     Physical Exam Updated Vital Signs BP 115/77 (BP Location: Left Arm)   Pulse (!) 101   Temp 98.9 F (37.2 C) (Oral)   Resp 20   Ht 5\' 3"  (1.6 m)   Wt 117.9 kg   LMP 02/24/2018 (Approximate)   SpO2 99%   BMI 46.04 kg/m   Physical Exam HENT:     Head: Normocephalic.     Right Ear: Tympanic membrane normal.     Left Ear: Tympanic membrane normal.     Mouth/Throat:     Tonsils: No tonsillar exudate or tonsillar abscesses.     Comments: Tonsillar and posterior pharyngeal erythema and swelling.  No  exudate. Cardiovascular:     Rate and Rhythm: Normal rate.  Pulmonary:     Effort: Pulmonary effort is normal.     Breath sounds: No wheezing, rhonchi or rales.  Abdominal:     Tenderness: There is no abdominal tenderness.  Lymphadenopathy:     Cervical: Cervical adenopathy present.  Skin:    Capillary Refill: Capillary refill takes less than 2 seconds.  Neurological:     Mental Status: She is alert.      ED Treatments / Results  Labs (all labs ordered are listed, but only abnormal results are displayed) Labs Reviewed  GROUP A STREP BY PCR - Abnormal; Notable for the following components:      Result Value   Group A Strep by PCR DETECTED (*)    All other components within normal limits    EKG None  Radiology No results found.  Procedures Procedures (including critical care time)  Medications Ordered in ED Medications  penicillin g benzathine (BICILLIN  LA) 1200000 UNIT/2ML injection 1.2 Million Units (1.2 Million Units Intramuscular Given 03/26/18 1941)  dexamethasone (DECADRON) tablet 6 mg (6 mg Oral Given 03/26/18 1940)     Initial Impression / Assessment and Plan / ED Course  I have reviewed the triage vital signs and the nursing notes.  Pertinent labs & imaging results that were available during my care of the patient were reviewed by me and considered in my medical decision making (see chart for details).     Patient with pharyngitis.  Positive strep test.  Will treat with steroids and penicillin.  Pain medicines given.  Patient change the pharmacy she wanted sent to and it was reordered.  Discharge home.  Do not see peritonsillar abscess or other severe abnormality at this time.  Final Clinical Impressions(s) / ED Diagnoses   Final diagnoses:  Strep pharyngitis    ED Discharge Orders         Ordered    HYDROcodone-acetaminophen (NORCO/VICODIN) 5-325 MG tablet  Every 6 hours PRN,   Status:  Discontinued     03/26/18 1955    HYDROcodone-acetaminophen (NORCO/VICODIN) 5-325 MG tablet  Every 6 hours PRN     03/26/18 Danie Binder2003           Alizay Bronkema, MD 03/26/18 2007
